# Patient Record
Sex: Male | Born: 1975 | Race: White | Hispanic: No | Marital: Married | Smoking: Former smoker
Health system: Southern US, Community
[De-identification: ages and names within clinical notes are randomized; demographics above are authoritative.]

## PROBLEM LIST (undated history)

## (undated) DIAGNOSIS — R7989 Other specified abnormal findings of blood chemistry: Secondary | ICD-10-CM

## (undated) HISTORY — PX: NASAL SEPTUM SURGERY: SHX37

## (undated) HISTORY — PX: NASAL SINUS SURGERY: SHX719

## (undated) HISTORY — PX: TURBINATE REDUCTION: SHX6157

## (undated) HISTORY — DX: Other specified abnormal findings of blood chemistry: R79.89

## (undated) HISTORY — PX: TONSILLECTOMY: SUR1361

---

## 2005-07-23 ENCOUNTER — Emergency Department: Payer: Self-pay | Admitting: Emergency Medicine

## 2006-06-05 ENCOUNTER — Ambulatory Visit: Payer: Self-pay | Admitting: Unknown Physician Specialty

## 2006-07-03 ENCOUNTER — Encounter: Payer: Self-pay | Admitting: Unknown Physician Specialty

## 2008-12-26 ENCOUNTER — Emergency Department (HOSPITAL_COMMUNITY): Admission: EM | Admit: 2008-12-26 | Discharge: 2008-12-27 | Payer: Self-pay | Admitting: Orthopaedic Surgery

## 2008-12-30 ENCOUNTER — Ambulatory Visit: Payer: Self-pay | Admitting: Family Medicine

## 2010-05-23 ENCOUNTER — Ambulatory Visit: Payer: Self-pay | Admitting: Unknown Physician Specialty

## 2010-05-24 LAB — PATHOLOGY REPORT

## 2010-05-31 LAB — GLUCOSE, CAPILLARY

## 2010-06-01 LAB — BASIC METABOLIC PANEL WITH GFR
BUN: 13 mg/dL (ref 6–23)
CO2: 25 meq/L (ref 19–32)
Calcium: 9.1 mg/dL (ref 8.4–10.5)
Chloride: 104 meq/L (ref 96–112)
Creatinine, Ser: 1.19 mg/dL (ref 0.4–1.5)
GFR calc non Af Amer: >60 mL/min
Glucose, Bld: 101 mg/dL — ABNORMAL HIGH (ref 70–99)
Potassium: 3.4 meq/L — ABNORMAL LOW (ref 3.5–5.1)
Sodium: 138 meq/L (ref 135–145)

## 2010-06-01 LAB — CBC
HCT: 43.6 % (ref 39.0–52.0)
Hemoglobin: 15.1 g/dL (ref 13.0–17.0)
MCHC: 34.6 g/dL (ref 30.0–36.0)
MCV: 88.5 fL (ref 78.0–100.0)
Platelets: 178 10*3/uL (ref 150–400)
RBC: 4.93 MIL/uL (ref 4.22–5.81)
RDW: 13.5 % (ref 11.5–15.5)
WBC: 10.3 10*3/uL (ref 4.0–10.5)

## 2010-06-01 LAB — PROTIME-INR
INR: 0.9 (ref 0.00–1.49)
Prothrombin Time: 12.1 s (ref 11.6–15.2)

## 2011-11-22 ENCOUNTER — Ambulatory Visit: Payer: Self-pay | Admitting: Family Medicine

## 2012-09-11 LAB — LIPID PANEL
CHOLESTEROL: 172 mg/dL (ref 0–200)
HDL: 43 mg/dL (ref 35–70)
LDL CALC: 112 mg/dL
LDL/HDL RATIO: 2.6
TRIGLYCERIDES: 83 mg/dL (ref 40–160)

## 2012-09-11 LAB — CBC AND DIFFERENTIAL
HCT: 47 % (ref 41–53)
Hemoglobin: 15.8 g/dL (ref 13.5–17.5)
NEUTROS ABS: 3 /uL
Platelets: 261 10*3/uL (ref 150–399)
WBC: 5.9 10*3/mL

## 2012-09-11 LAB — TSH: TSH: 1.07 u[IU]/mL (ref 0.41–5.90)

## 2012-09-11 LAB — HEPATIC FUNCTION PANEL
ALK PHOS: 43 U/L (ref 25–125)
ALT: 27 U/L (ref 10–40)
AST: 31 U/L (ref 14–40)
Bilirubin, Total: 1.2 mg/dL

## 2012-09-11 LAB — BASIC METABOLIC PANEL
BUN: 19 mg/dL (ref 4–21)
CREATININE: 1.4 mg/dL — AB (ref 0.6–1.3)
GLUCOSE: 82 mg/dL
Potassium: 4.2 mmol/L (ref 3.4–5.3)
Sodium: 140 mmol/L (ref 137–147)

## 2012-09-11 LAB — PSA: PSA: 1.6

## 2014-04-16 IMAGING — US ABDOMEN ULTRASOUND LIMITED
1 series · 13 of 25 positions shown · non-contrast
Comparison: none

REASON FOR EXAM: epigastric abd pain
COMMENTS:

[Series 1: abdomen ultrasound limited · 0.35mm/px · 13 of 86 slices shown]
[im 1/86]
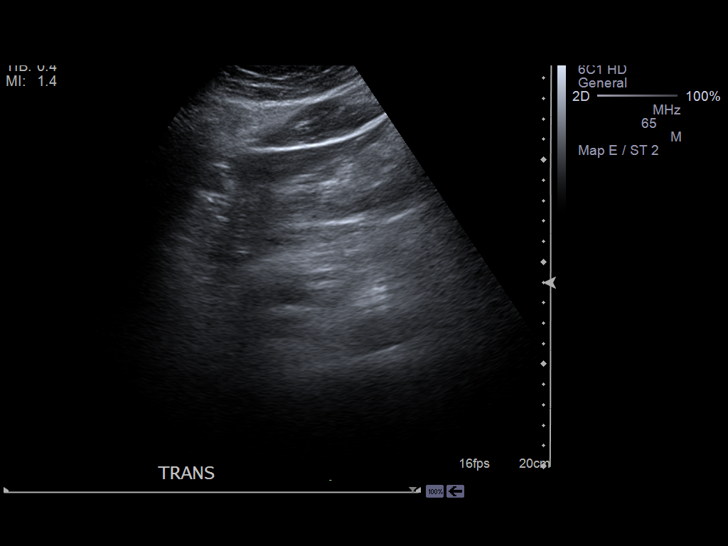
[im 8/86]
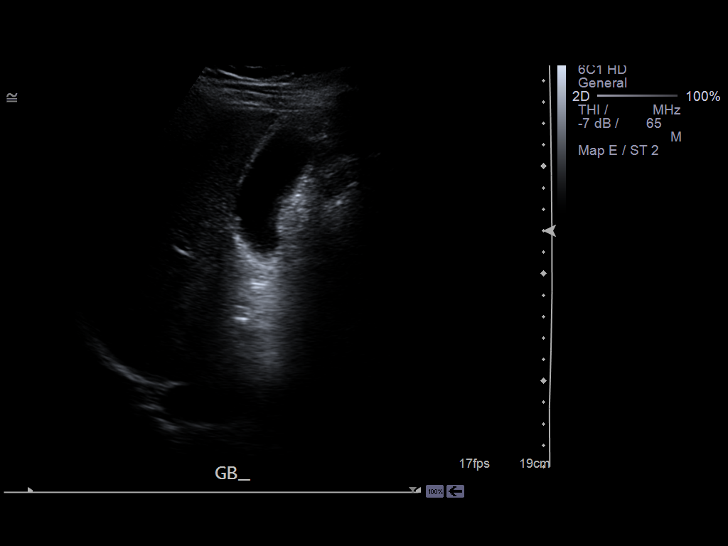
[im 15/86]
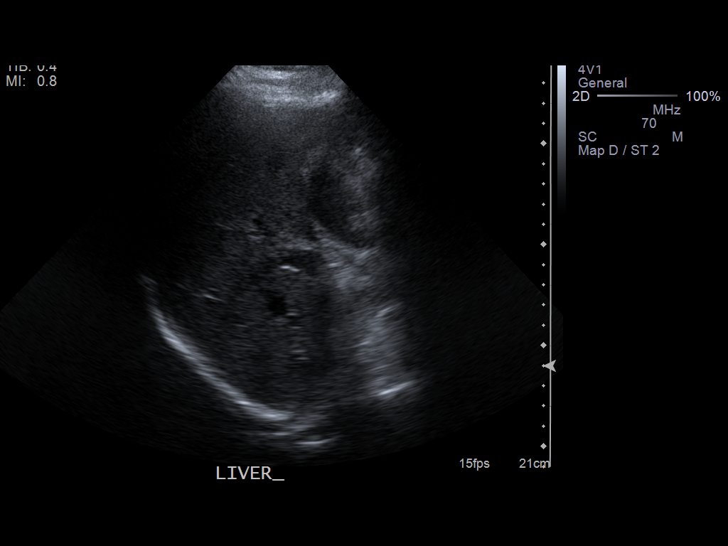
[im 22/86]
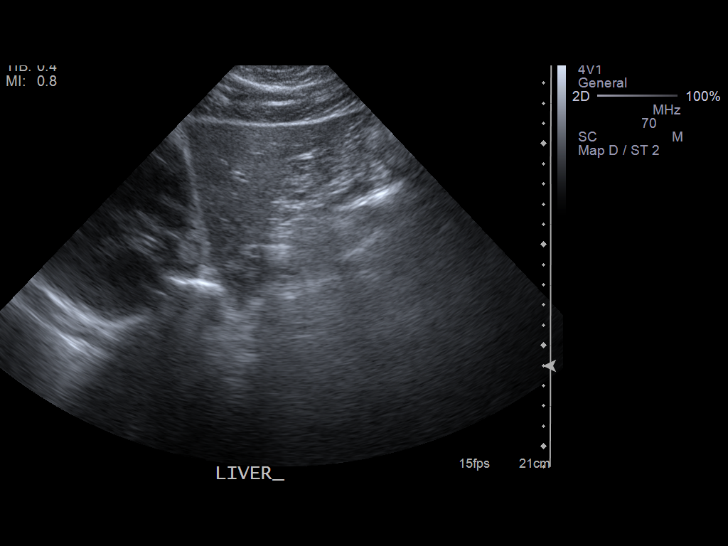
[im 29/86]
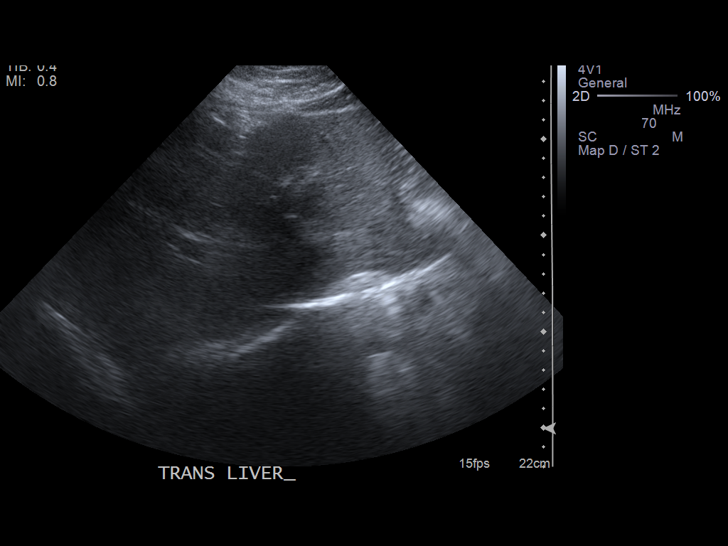
[im 36/86]
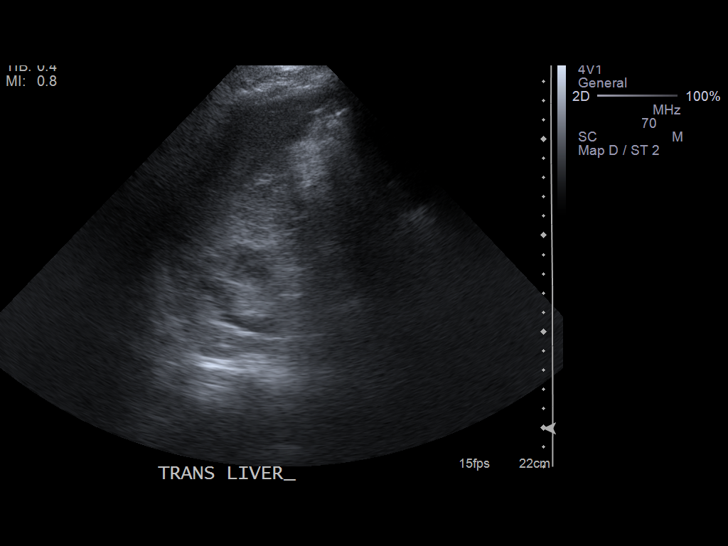
[im 43/86]
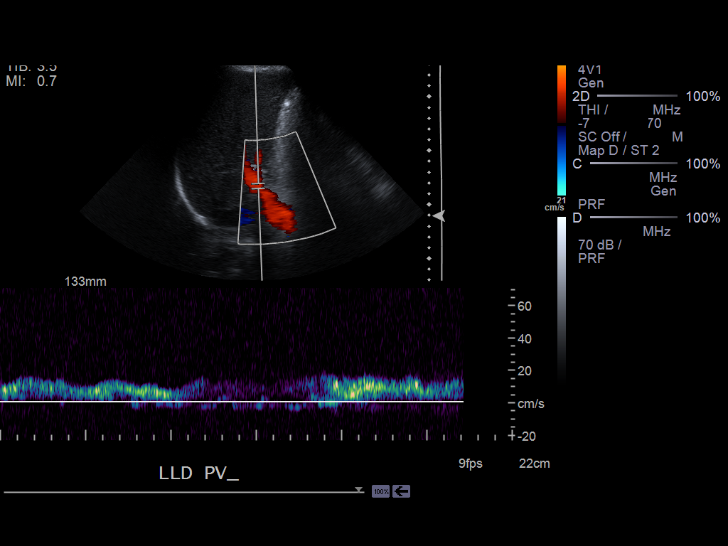
[im 50/86]
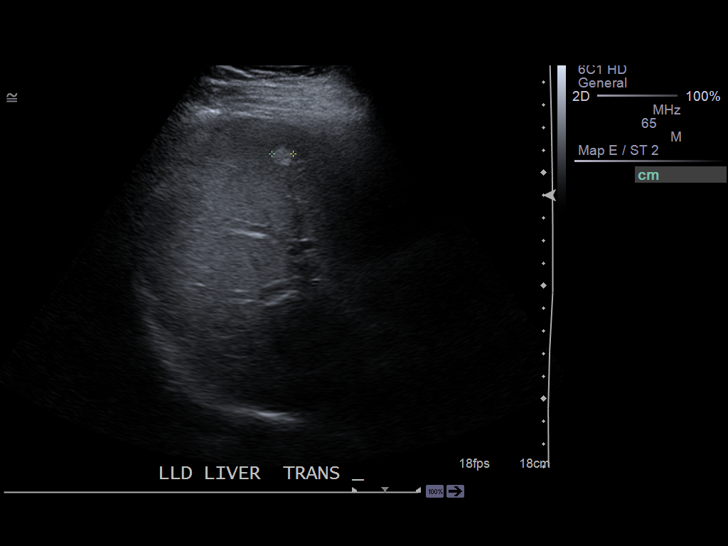
[im 57/86]
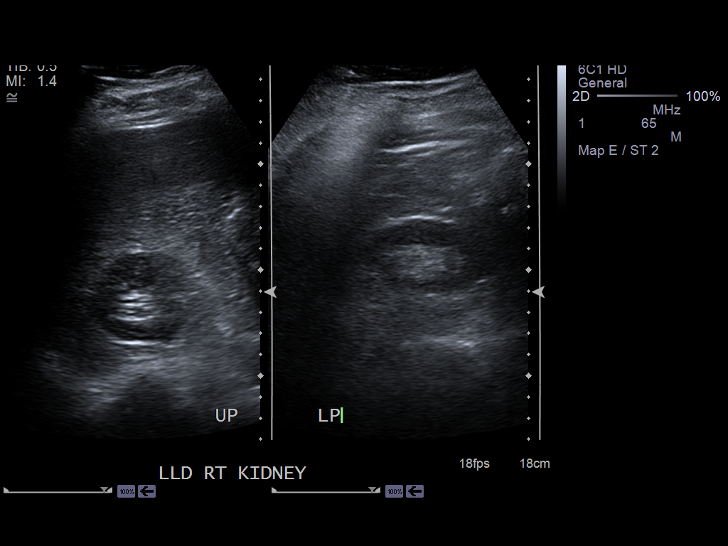
[im 64/86]
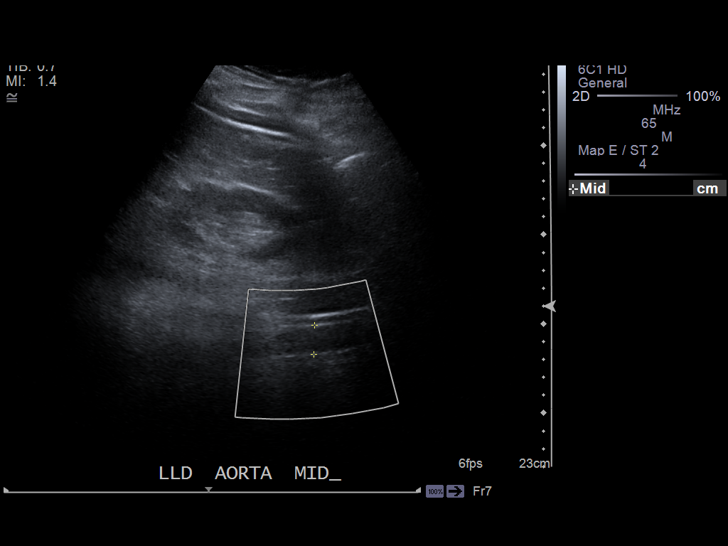
[im 71/86]
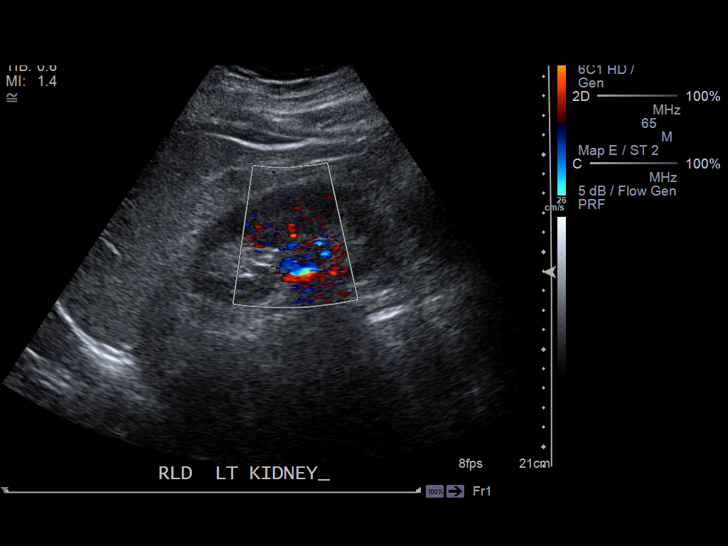
[im 78/86]
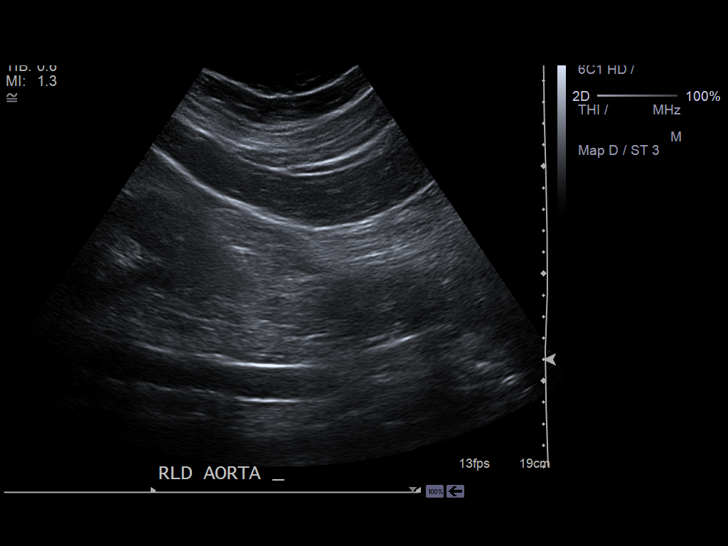
[im 86/86]
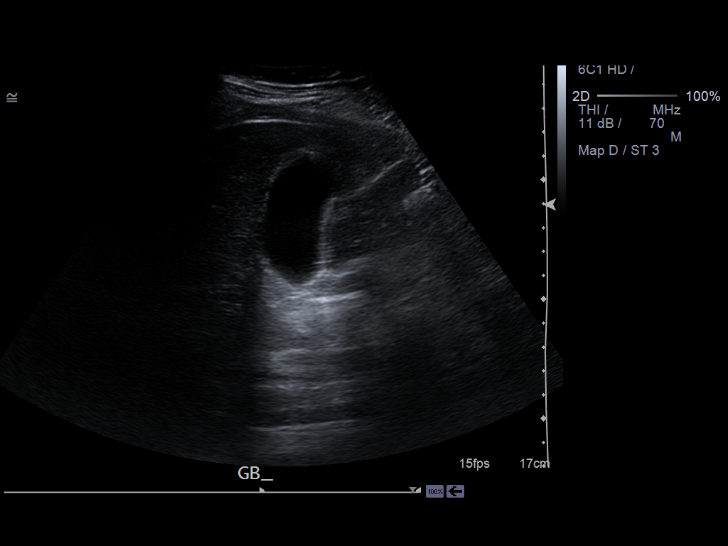

[13 of 25 positions shown; findings below may reference images not displayed]

PROCEDURE:     CALEB - CALEB ABDOMEN UPPER GENERAL  - November 22, 2011  [DATE]

RESULT:     The liver exhibits normal echotexture with the exception of a
hyperechoic focus in the right lobe measuring 9 mm in diameter. This is most
compatible with a hemangioma. There is no intrahepatic ductal dilation.
Portal venous flow is normal in direction toward the liver.

The gallbladder is adequately distended. There is a tiny echogenic focus
adherent to the gallbladder wall which may reflect a tiny polyp or
nonshadowing stone. It measures no more than 1 mm in diameter. There is no
positive sonographic Murphy's sign. The common bile duct measures 4.7 mm in
diameter.

The pancreas, abdominal aorta, and kidneys are normal in appearance. The
spleen is top normal in size at 12.6 cm. The inferior vena cava is
demonstrated and appears normal. The kidneys are normal in contour and
exhibit no evidence of obstruction or calcified stones. Both kidneys measure
10.3 cm in length.
IMPRESSION: 1. There is a 1 mm diameter hyperechoic focus adherent to the gallbladder
wall which may reflect a nonshadowing stone or polyp. There are no
sonographic findings to suggest acute cholecystitis.
2. There is a 9 mm diameter hyperechoic focus in the right hepatic lobe most
compatible with a hemangioma.
3. There is borderline splenomegaly.
4. The pancreas kidneys exhibit no acute abnormality.

[REDACTED]

## 2014-09-15 DIAGNOSIS — E669 Obesity, unspecified: Secondary | ICD-10-CM | POA: Insufficient documentation

## 2014-09-15 DIAGNOSIS — K219 Gastro-esophageal reflux disease without esophagitis: Secondary | ICD-10-CM | POA: Insufficient documentation

## 2014-09-15 DIAGNOSIS — Z87891 Personal history of nicotine dependence: Secondary | ICD-10-CM | POA: Insufficient documentation

## 2014-09-15 DIAGNOSIS — F32A Depression, unspecified: Secondary | ICD-10-CM | POA: Insufficient documentation

## 2014-09-15 DIAGNOSIS — E291 Testicular hypofunction: Secondary | ICD-10-CM | POA: Insufficient documentation

## 2014-09-15 DIAGNOSIS — G4720 Circadian rhythm sleep disorder, unspecified type: Secondary | ICD-10-CM | POA: Insufficient documentation

## 2014-09-15 DIAGNOSIS — F329 Major depressive disorder, single episode, unspecified: Secondary | ICD-10-CM | POA: Insufficient documentation

## 2014-09-16 ENCOUNTER — Encounter: Payer: Self-pay | Admitting: Family Medicine

## 2014-09-16 ENCOUNTER — Ambulatory Visit (INDEPENDENT_AMBULATORY_CARE_PROVIDER_SITE_OTHER): Payer: Managed Care, Other (non HMO) | Admitting: Family Medicine

## 2014-09-16 VITALS — BP 104/62 | HR 64 | Temp 97.8°F | Resp 16 | Ht 71.0 in | Wt 240.0 lb

## 2014-09-16 DIAGNOSIS — Z0000 Encounter for general adult medical examination without abnormal findings: Secondary | ICD-10-CM | POA: Diagnosis not present

## 2014-09-16 DIAGNOSIS — E291 Testicular hypofunction: Secondary | ICD-10-CM | POA: Diagnosis not present

## 2014-09-16 LAB — POCT URINALYSIS DIPSTICK
BILIRUBIN UA: NEGATIVE
GLUCOSE UA: NEGATIVE
Ketones, UA: NEGATIVE
LEUKOCYTES UA: NEGATIVE
Nitrite, UA: NEGATIVE
PROTEIN UA: NEGATIVE
RBC UA: NEGATIVE
SPEC GRAV UA: 1.02
UROBILINOGEN UA: 0.2
pH, UA: 6

## 2014-09-16 MED ORDER — TESTOSTERONE 20.25 MG/ACT (1.62%) TD GEL: 4.0000 [IU] | Freq: Every day | TRANSDERMAL | Status: DC

## 2014-09-16 NOTE — Progress Notes (Signed)
Patient ID: David Aguirre, male   DOB: [birth year], 39 y.o.   MRN: [id number]       [name], Male    DOB: 1975/03/09, 39 y.o.   MRN: [id number] Visit Date: 09/16/2014  Today's Provider: Wilhemena Durie, MD   Chief Complaint  Patient presents with  . Annual Exam   Subjective:    Annual physical exam David Aguirre is a 39 y.o. male who presents today for health maintenance and complete physical. He feels fairly well. He reports exercising once or twice a week. He reports he is sleeping fairly well.  ----------------------------------------------------------------- EKG- 11/20/11 Tdap-- pt is confident that he has had one in the past 5 years  Review of Systems  Constitutional: Positive for fatigue.       Patient definitely feels some fatigue when he is off of testosterone therapy. He has been off for 3 weeks and is having a little bit of fatigue now.  HENT: Negative.   Eyes: Negative.   Respiratory: Negative.   Cardiovascular: Negative.   Gastrointestinal: Negative.   Endocrine: Negative.   Genitourinary: Negative.        Recent single episode of ED.  Musculoskeletal: Negative.   Allergic/Immunologic: Negative.   Neurological: Negative.   Hematological: Negative.   Psychiatric/Behavioral: Negative.   All other systems reviewed and are negative.   Social History He  reports that he quit smoking about 16 years ago. He does not have any smokeless tobacco history on file. He reports that he drinks alcohol. He reports that he does not use illicit drugs.  Patient Active Problem List   Diagnosis Date Noted  . Clinical depression 09/15/2014  . Acid reflux 09/15/2014  . 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 History of tobacco use 09/15/2014  . 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 Tonsillectomy    . Nasal septum surgery    . Turbinate reduction Bilateral     Family  History His family history includes Diabetes in his mother; Hyperlipidemia in his father and mother; Prostate cancer in his paternal grandfather.    No Known Allergies  Previous Medications   MULTIPLE VITAMIN PO    Take by mouth.   PHENTERMINE (ADIPEX-P) 37.5 MG TABLET    Take by mouth.   TESTOSTERONE (ANDROGEL PUMP) 20.25 MG/ACT (1.62%) GEL    Place onto the skin.    Patient Care Team: Jerrol Banana., MD as PCP - General (Family Medicine)     Objective:   Vitals: BP 104/62 mmHg  Pulse 64  Temp(Src) 97.8 F (36.6 C) (Oral)  Resp 16  Ht 5\' 11"  (1.803 m)  Wt 240 lb (108.863 kg)  BMI 33.49 kg/m2   Physical Exam  Constitutional: He is oriented to person, place, and time. He appears well-developed and well-nourished.  HENT:  Head: Normocephalic and atraumatic.  Right Ear: External ear normal.  Left Ear: External ear normal.  Nose: Nose normal.  Mouth/Throat: Oropharynx is clear and moist.  Eyes: Conjunctivae are normal. Pupils are equal, round, and reactive to light.  Neck: Normal range of motion. Neck supple.  Cardiovascular: Normal rate, regular rhythm, normal heart sounds and intact distal pulses.   Pulmonary/Chest: Effort normal and breath sounds normal.  Abdominal: Soft. Bowel sounds are normal.  Genitourinary: Penis normal.  Musculoskeletal: Normal range of motion.  Neurological: He is alert and oriented to person, place, and time. No cranial nerve deficit. He  exhibits normal muscle tone. Coordination normal.  Skin: Skin is warm and dry.  Several nevi on back which are benign.  Psychiatric: He has a normal mood and affect. His behavior is normal. Judgment and thought content normal.     Depression Screen No flowsheet data found.    Assessment & Plan:     Routine Health Maintenance and Physical Exam  Exercise Activities and Dietary recommendations Goals    None       There is no immunization history on file for this patient.  Health  Maintenance  Topic Date Due  . HIV Screening  08/11/1991  . TETANUS/TDAP  08/11/1995  . INFLUENZA VACCINE  09/27/2014  D    Discussed health benefits of physical activity, and encouraged him to engage in regular exercise appropriate for his age and condition.     Hypogonadism --Discussed at length with pt. Restart Adndrogel at 4 pumps daily.RTC 4 months to reassess.Obtain Prolactin today. PSA yearly while on therapy. I have done the exam and reviewed the above chart and it is accurate to the best of my knowledge.  --------------------------------------------------------------------

## 2014-09-17 LAB — CBC WITH DIFFERENTIAL/PLATELET
BASOS: 1 %
Basophils Absolute: 0 10*3/uL (ref 0.0–0.2)
EOS (ABSOLUTE): 0.1 10*3/uL (ref 0.0–0.4)
Eos: 2 %
HEMATOCRIT: 46.5 % (ref 37.5–51.0)
Hemoglobin: 16 g/dL (ref 12.6–17.7)
IMMATURE GRANULOCYTES: 0 %
Immature Grans (Abs): 0 10*3/uL (ref 0.0–0.1)
Lymphocytes Absolute: 2.3 10*3/uL (ref 0.7–3.1)
Lymphs: 35 %
MCH: 29.2 pg (ref 26.6–33.0)
MCHC: 34.4 g/dL (ref 31.5–35.7)
MCV: 85 fL (ref 79–97)
MONOCYTES: 9 %
Monocytes Absolute: 0.6 10*3/uL (ref 0.1–0.9)
NEUTROS ABS: 3.6 10*3/uL (ref 1.4–7.0)
Neutrophils: 53 %
Platelets: 268 10*3/uL (ref 150–379)
RBC: 5.48 x10E6/uL (ref 4.14–5.80)
RDW: 13.6 % (ref 12.3–15.4)
WBC: 6.6 10*3/uL (ref 3.4–10.8)

## 2014-09-17 LAB — LIPID PANEL WITH LDL/HDL RATIO
Cholesterol, Total: 164 mg/dL (ref 100–199)
HDL: 44 mg/dL (ref >39)
LDL CALC: 105 mg/dL — AB (ref 0–99)
LDL/HDL RATIO: 2.4 ratio (ref 0.0–3.6)
TRIGLYCERIDES: 74 mg/dL (ref 0–149)
VLDL Cholesterol Cal: 15 mg/dL (ref 5–40)

## 2014-09-17 LAB — COMPREHENSIVE METABOLIC PANEL
ALT: 26 IU/L (ref 0–44)
AST: 23 IU/L (ref 0–40)
Albumin/Globulin Ratio: 2.4 (ref 1.1–2.5)
Albumin: 4.7 g/dL (ref 3.5–5.5)
Alkaline Phosphatase: 40 IU/L (ref 39–117)
BILIRUBIN TOTAL: 0.5 mg/dL (ref 0.0–1.2)
BUN / CREAT RATIO: 19 (ref 8–19)
BUN: 25 mg/dL — AB (ref 6–20)
CALCIUM: 9.3 mg/dL (ref 8.7–10.2)
CO2: 25 mmol/L (ref 18–29)
Chloride: 100 mmol/L (ref 97–108)
Creatinine, Ser: 1.3 mg/dL — ABNORMAL HIGH (ref 0.76–1.27)
GFR calc Af Amer: 80 mL/min/{1.73_m2} (ref >59)
GFR calc non Af Amer: 69 mL/min/{1.73_m2} (ref >59)
Globulin, Total: 2 g/dL (ref 1.5–4.5)
Glucose: 92 mg/dL (ref 65–99)
Potassium: 4.8 mmol/L (ref 3.5–5.2)
Sodium: 141 mmol/L (ref 134–144)
TOTAL PROTEIN: 6.7 g/dL (ref 6.0–8.5)

## 2014-09-17 LAB — TESTOSTERONE, FREE, TOTAL, SHBG: Testosterone, Free: 8.6 pg/mL — ABNORMAL LOW (ref 8.7–25.1)

## 2014-09-17 LAB — PROLACTIN: PROLACTIN: 7 ng/mL (ref 4.0–15.2)

## 2014-09-17 LAB — TSH: TSH: 1.21 u[IU]/mL (ref 0.450–4.500)

## 2014-11-04 ENCOUNTER — Other Ambulatory Visit: Payer: Self-pay

## 2014-11-04 NOTE — Telephone Encounter (Signed)
Fax from pharmacy for refill-aa

## 2014-11-05 MED ORDER — PHENTERMINE HCL 37.5 MG PO TABS: 37.5000 mg | ORAL_TABLET | Freq: Every day | ORAL | Status: DC

## 2014-11-27 ENCOUNTER — Encounter: Payer: Self-pay | Admitting: Emergency Medicine

## 2014-11-27 ENCOUNTER — Emergency Department: Payer: Managed Care, Other (non HMO)

## 2014-11-27 ENCOUNTER — Observation Stay
Admission: EM | Admit: 2014-11-27 | Discharge: 2014-11-29 | Disposition: A | Discharge status: Home / Self Care | Payer: Managed Care, Other (non HMO) | Attending: Surgery | Admitting: Surgery

## 2014-11-27 DIAGNOSIS — Z833 Family history of diabetes mellitus: Secondary | ICD-10-CM | POA: Insufficient documentation

## 2014-11-27 DIAGNOSIS — E291 Testicular hypofunction: Secondary | ICD-10-CM | POA: Insufficient documentation

## 2014-11-27 DIAGNOSIS — Z9889 Other specified postprocedural states: Secondary | ICD-10-CM | POA: Insufficient documentation

## 2014-11-27 DIAGNOSIS — Z8489 Family history of other specified conditions: Secondary | ICD-10-CM | POA: Insufficient documentation

## 2014-11-27 DIAGNOSIS — D1803 Hemangioma of intra-abdominal structures: Secondary | ICD-10-CM | POA: Insufficient documentation

## 2014-11-27 DIAGNOSIS — Z8042 Family history of malignant neoplasm of prostate: Secondary | ICD-10-CM | POA: Diagnosis not present

## 2014-11-27 DIAGNOSIS — K8012 Calculus of gallbladder with acute and chronic cholecystitis without obstruction: Principal | ICD-10-CM | POA: Insufficient documentation

## 2014-11-27 DIAGNOSIS — K8050 Calculus of bile duct without cholangitis or cholecystitis without obstruction: Secondary | ICD-10-CM | POA: Diagnosis present

## 2014-11-27 DIAGNOSIS — Z87891 Personal history of nicotine dependence: Secondary | ICD-10-CM | POA: Diagnosis not present

## 2014-11-27 LAB — TROPONIN I: Troponin I: <0.03 ng/mL (ref <0.031)

## 2014-11-27 LAB — URINALYSIS COMPLETE WITH MICROSCOPIC (ARMC ONLY)
BACTERIA UA: NONE SEEN
BILIRUBIN URINE: NEGATIVE
Glucose, UA: NEGATIVE mg/dL
HGB URINE DIPSTICK: NEGATIVE
Ketones, ur: NEGATIVE mg/dL
LEUKOCYTES UA: NEGATIVE
Nitrite: NEGATIVE
PH: 5 (ref 5.0–8.0)
PROTEIN: NEGATIVE mg/dL
SQUAMOUS EPITHELIAL / LPF: NONE SEEN
Specific Gravity, Urine: 1.013 (ref 1.005–1.030)

## 2014-11-27 LAB — COMPREHENSIVE METABOLIC PANEL
ALBUMIN: 4.2 g/dL (ref 3.5–5.0)
ALT: 23 U/L (ref 17–63)
AST: 25 U/L (ref 15–41)
Alkaline Phosphatase: 36 U/L — ABNORMAL LOW (ref 38–126)
Anion gap: 8 (ref 5–15)
BILIRUBIN TOTAL: 0.9 mg/dL (ref 0.3–1.2)
BUN: 18 mg/dL (ref 6–20)
CO2: 27 mmol/L (ref 22–32)
Calcium: 9.2 mg/dL (ref 8.9–10.3)
Chloride: 100 mmol/L — ABNORMAL LOW (ref 101–111)
Creatinine, Ser: 1.2 mg/dL (ref 0.61–1.24)
GFR calc Af Amer: >60 mL/min (ref >60)
GFR calc non Af Amer: >60 mL/min (ref >60)
GLUCOSE: 116 mg/dL — AB (ref 65–99)
POTASSIUM: 4 mmol/L (ref 3.5–5.1)
SODIUM: 135 mmol/L (ref 135–145)
TOTAL PROTEIN: 6.7 g/dL (ref 6.5–8.1)

## 2014-11-27 LAB — CBC WITH DIFFERENTIAL/PLATELET
BASOS ABS: 0.1 10*3/uL (ref 0–0.1)
BASOS PCT: 1 %
Eosinophils Absolute: 0.1 10*3/uL (ref 0–0.7)
Eosinophils Relative: 1 %
HEMATOCRIT: 48.6 % (ref 40.0–52.0)
HEMOGLOBIN: 16.1 g/dL (ref 13.0–18.0)
Lymphocytes Relative: 11 %
Lymphs Abs: 1.4 10*3/uL (ref 1.0–3.6)
MCH: 28.9 pg (ref 26.0–34.0)
MCHC: 33.1 g/dL (ref 32.0–36.0)
MCV: 87.3 fL (ref 80.0–100.0)
MONO ABS: 0.7 10*3/uL (ref 0.2–1.0)
Monocytes Relative: 6 %
NEUTROS ABS: 10.3 10*3/uL — AB (ref 1.4–6.5)
NEUTROS PCT: 83 %
Platelets: 229 10*3/uL (ref 150–440)
RBC: 5.57 MIL/uL (ref 4.40–5.90)
RDW: 13.9 % (ref 11.5–14.5)
WBC: 12.5 10*3/uL — ABNORMAL HIGH (ref 3.8–10.6)

## 2014-11-27 LAB — LIPASE, BLOOD: Lipase: 36 U/L (ref 22–51)

## 2014-11-27 MED ORDER — MORPHINE SULFATE (PF) 4 MG/ML IV SOLN
4.0000 mg | Freq: Once | INTRAVENOUS | Status: AC
  Administered 2014-11-27: 4 mg via INTRAVENOUS
  Filled 2014-11-27: qty 1

## 2014-11-27 MED ORDER — SODIUM CHLORIDE 0.9 % IV BOLUS (SEPSIS)
1000.0000 mL | Freq: Once | INTRAVENOUS | Status: AC
  Administered 2014-11-27: 1000 mL via INTRAVENOUS

## 2014-11-27 MED ORDER — ONDANSETRON HCL 4 MG/2ML IJ SOLN
4.0000 mg | Freq: Once | INTRAMUSCULAR | Status: AC
Start: 2014-11-27 — End: 2014-11-27
  Administered 2014-11-27: 4 mg via INTRAVENOUS
  Filled 2014-11-27: qty 2

## 2014-11-27 MED ORDER — MORPHINE SULFATE (PF) 4 MG/ML IV SOLN
4.0000 mg | Freq: Once | INTRAVENOUS | Status: AC
  Administered 2014-11-27: 4 mg via INTRAVENOUS

## 2014-11-27 MED ORDER — MORPHINE SULFATE (PF) 4 MG/ML IV SOLN
INTRAVENOUS | Status: AC
  Administered 2014-11-27: 4 mg via INTRAVENOUS
  Filled 2014-11-27: qty 1

## 2014-11-27 NOTE — ED Notes (Signed)
Denies nausea, no vomiting, no diarrhea

## 2014-11-27 NOTE — ED Provider Notes (Signed)
[facility] Emergency Department Provider Note  Time seen: 9:48 PM  I have reviewed the triage vital signs and the nursing notes.   HISTORY  Chief Complaint Abdominal Pain    HPI David Aguirre is a 39 y.o. male with no past medical history who presents the emergency department with right upper quadrant pain. According to the patient around 11 AM this morning he developed right upper quadrant pain which has been progressive and unrelenting since. Denies any pain similar in the past. Denies nausea, vomiting, diarrhea. Denies fever. Denies any known modifying factors, of note the patient has not attempted to eat anything.Describes the pain as moderate to severe currently.    History reviewed. No pertinent past medical history.  Patient Active Problem List   Diagnosis Date Noted  . 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 Clinical depression 09/15/2014  . Acid reflux 09/15/2014  . 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 History of tobacco use 09/15/2014  . 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 Tonsillectomy    . Nasal septum surgery    . Turbinate reduction Bilateral   . Nasal sinus surgery      Current Outpatient Rx  Name  Route  Sig  Dispense  Refill  . MULTIPLE VITAMIN PO   Oral   Take by mouth.         . phentermine (ADIPEX-P) 37.5 MG tablet   Oral   Take 1 tablet (37.5 mg total) by mouth daily before breakfast.   30 tablet   0   . Testosterone (ANDROGEL PUMP) 20.25 MG/ACT (1.62%) GEL   Transdermal   Place 4 Units onto the skin daily. 4 pumps daily QS   150 g   5     Allergies Review of patient's allergies indicates no known allergies.  Family History  Problem Relation Age of Onset  . Diabetes Mother   . Hyperlipidemia Mother   . Hyperlipidemia Father   . Prostate cancer Paternal Grandfather     Social History Social History  Substance Use Topics  . Smoking  status: Former Smoker    Quit date: 02/26/1998  . Smokeless tobacco: None  . Alcohol Use: Yes     Comment: 2-3 times a week    Review of Systems Constitutional: Negative for fever. Cardiovascular: Negative for chest pain. Respiratory: Negative for shortness of breath. Gastrointestinal: Right upper quadrant abdominal pain. Genitourinary: Negative for dysuria. Musculoskeletal: Negative for back pain. Neurological: Negative for headaches, focal weakness or numbness. 10-point ROS otherwise negative.  ____________________________________________   PHYSICAL EXAM:  VITAL SIGNS: ED Triage Vitals  Enc Vitals Group     BP 11/27/14 1852 146/97 mmHg     Pulse Rate 11/27/14 1852 0     Resp 11/27/14 1852 20     Temp 11/27/14 1852 97.8 F (36.6 C)     Temp Source 11/27/14 1852 Oral     SpO2 11/27/14 1852 100 %     Weight 11/27/14 1852 248 lb (112.492 kg)     Height 11/27/14 1852 6' (1.829 m)     Head Cir --      Peak Flow --      Pain Score 11/27/14 1853 2     Pain Loc --      Pain Edu? --      Excl. in [place]? --    Constitutional: Alert and oriented. Mild distress due to abdominal pain. Eyes: Normal exam ENT  Head: Normocephalic and atraumatic   Mouth/Throat: Mucous membranes are moist. Cardiovascular: Normal rate, regular rhythm. No murmur Respiratory: Normal respiratory effort without tachypnea nor retractions. Breath sounds are clear  Gastrointestinal: Soft, mild epigastric tenderness palpation. No rebound or guarding. No distention. Musculoskeletal: Nontender with normal range of motion in all extremities Neurologic:  Normal speech and language. No gross focal neurologic deficits  Psychiatric: Mood and affect are normal. Speech and behavior are normal.  ____________________________________________    EKG  EKG reviewed and interpreted by myself shows sinus rhythm at 70 bpm, narrow QRS, normal axis, normal intervals, no ST changes  noted.  ____________________________________________    RADIOLOGY  Gallstone in the neck of the gallbladder.  ____________________________________________    INITIAL IMPRESSION / ASSESSMENT AND PLAN / ED COURSE  Pertinent labs & imaging results that were available during my care of the patient were reviewed by me and considered in my medical decision making (see chart for details).  Patient with approximately 11 hours of right upper quadrant abdominal pain which began at 11 AM. We will dose pain medication, and obtain an ultrasound to help further evaluate. Currently labs are largely within normal limits besides a mild leukocytosis. Minimal tenderness on exam. I have also added on a troponin although the patient does have a normal EKG.  Labs are largely within normal limits besides leukocytosis of 12.5. Patient continues to be uncomfortable status post 2 rounds of IV morphine. He states the pain is much improved but continues to have pain. His ultrasound shows a gallstone in the neck of the gallbladder. I discussed the patient with Dr. Marina Gravel will be down to see the patient to discuss further management.  ____________________________________________   FINAL CLINICAL IMPRESSION(S) / ED DIAGNOSES  Epigastric abdominal pain and right upper quadrant abdominal pain. Biliary colic  Harvest Dark, MD 11/27/14 [phone]

## 2014-11-28 ENCOUNTER — Encounter
Admission: EM | Disposition: A | Discharge status: Home / Self Care | Payer: Self-pay | Source: Home / Self Care | Attending: Emergency Medicine

## 2014-11-28 ENCOUNTER — Observation Stay: Payer: Managed Care, Other (non HMO) | Admitting: Anesthesiology

## 2014-11-28 ENCOUNTER — Encounter: Payer: Self-pay | Admitting: Anesthesiology

## 2014-11-28 DIAGNOSIS — K8050 Calculus of bile duct without cholangitis or cholecystitis without obstruction: Secondary | ICD-10-CM

## 2014-11-28 DIAGNOSIS — K8020 Calculus of gallbladder without cholecystitis without obstruction: Secondary | ICD-10-CM | POA: Diagnosis not present

## 2014-11-28 HISTORY — PX: CHOLECYSTECTOMY: SHX55

## 2014-11-28 LAB — MRSA PCR SCREENING: MRSA by PCR: NEGATIVE

## 2014-11-28 SURGERY — LAPAROSCOPIC CHOLECYSTECTOMY WITH INTRAOPERATIVE CHOLANGIOGRAM
Anesthesia: General

## 2014-11-28 MED ORDER — OXYCODONE-ACETAMINOPHEN 5-325 MG PO TABS: 1.0000 | ORAL_TABLET | ORAL | Status: DC | PRN

## 2014-11-28 MED ORDER — LACTATED RINGERS IV SOLN
INTRAVENOUS | Status: DC
  Administered 2014-11-28: 15:00:00 via INTRAVENOUS

## 2014-11-28 MED ORDER — BUPIVACAINE-EPINEPHRINE (PF) 0.25% -1:200000 IJ SOLN
INTRAMUSCULAR | Status: DC | PRN
  Administered 2014-11-28: 30 mL via PERINEURAL

## 2014-11-28 MED ORDER — FENTANYL CITRATE (PF) 100 MCG/2ML IJ SOLN
INTRAMUSCULAR | Status: DC | PRN
  Administered 2014-11-28 (×2): 100 ug via INTRAVENOUS
  Administered 2014-11-28: 150 ug via INTRAVENOUS

## 2014-11-28 MED ORDER — LACTATED RINGERS IV SOLN
INTRAVENOUS | Status: DC | PRN
  Administered 2014-11-28: 13:00:00 via INTRAVENOUS

## 2014-11-28 MED ORDER — FENTANYL CITRATE (PF) 100 MCG/2ML IJ SOLN
INTRAMUSCULAR | Status: AC
  Filled 2014-11-28: qty 2

## 2014-11-28 MED ORDER — BUPIVACAINE HCL (PF) 0.25 % IJ SOLN
INTRAMUSCULAR | Status: AC
  Filled 2014-11-28: qty 30

## 2014-11-28 MED ORDER — PROPOFOL 10 MG/ML IV BOLUS
INTRAVENOUS | Status: DC | PRN
  Administered 2014-11-28: 200 mg via INTRAVENOUS

## 2014-11-28 MED ORDER — GLYCOPYRROLATE 0.2 MG/ML IJ SOLN
INTRAMUSCULAR | Status: DC | PRN
  Administered 2014-11-28: .8 mg via INTRAVENOUS

## 2014-11-28 MED ORDER — OXYCODONE HCL 5 MG/5ML PO SOLN: 5.0000 mg | Freq: Once | ORAL | Status: DC | PRN

## 2014-11-28 MED ORDER — ROCURONIUM BROMIDE 100 MG/10ML IV SOLN
INTRAVENOUS | Status: DC | PRN
  Administered 2014-11-28: 50 mg via INTRAVENOUS

## 2014-11-28 MED ORDER — ONDANSETRON HCL 4 MG PO TABS
4.0000 mg | ORAL_TABLET | Freq: Four times a day (QID) | ORAL | Status: DC | PRN
Start: 2014-11-28 — End: 2014-11-29

## 2014-11-28 MED ORDER — FENTANYL CITRATE (PF) 100 MCG/2ML IJ SOLN
25.0000 ug | INTRAMUSCULAR | Status: DC | PRN
  Administered 2014-11-28 (×2): 25 ug via INTRAVENOUS

## 2014-11-28 MED ORDER — ONDANSETRON HCL 4 MG/2ML IJ SOLN: 4.0000 mg | Freq: Four times a day (QID) | INTRAMUSCULAR | Status: DC | PRN

## 2014-11-28 MED ORDER — ACETAMINOPHEN 650 MG RE SUPP: 650.0000 mg | Freq: Four times a day (QID) | RECTAL | Status: DC | PRN

## 2014-11-28 MED ORDER — ENOXAPARIN SODIUM 40 MG/0.4ML ~~LOC~~ SOLN
40.0000 mg | Freq: Every day | SUBCUTANEOUS | Status: DC
  Administered 2014-11-28 – 2014-11-29 (×2): 40 mg via SUBCUTANEOUS
  Filled 2014-11-28: qty 0.4

## 2014-11-28 MED ORDER — OXYCODONE-ACETAMINOPHEN 5-325 MG PO TABS
1.0000 | ORAL_TABLET | ORAL | Status: DC | PRN
  Administered 2014-11-29: 1 via ORAL
  Filled 2014-11-28: qty 1

## 2014-11-28 MED ORDER — MIDAZOLAM HCL 2 MG/2ML IJ SOLN
INTRAMUSCULAR | Status: DC | PRN
  Administered 2014-11-28: 2 mg via INTRAVENOUS

## 2014-11-28 MED ORDER — SUCCINYLCHOLINE CHLORIDE 20 MG/ML IJ SOLN
INTRAMUSCULAR | Status: DC | PRN
  Administered 2014-11-28: 100 mg via INTRAVENOUS

## 2014-11-28 MED ORDER — OXYCODONE HCL 5 MG PO TABS: 5.0000 mg | ORAL_TABLET | Freq: Once | ORAL | Status: DC | PRN

## 2014-11-28 MED ORDER — BUPIVACAINE-EPINEPHRINE (PF) 0.25% -1:200000 IJ SOLN
INTRAMUSCULAR | Status: AC
  Filled 2014-11-28: qty 30

## 2014-11-28 MED ORDER — KCL IN DEXTROSE-NACL 20-5-0.45 MEQ/L-%-% IV SOLN
INTRAVENOUS | Status: DC
  Administered 2014-11-28 (×2): via INTRAVENOUS
  Filled 2014-11-28 (×5): qty 1000

## 2014-11-28 MED ORDER — NEOSTIGMINE METHYLSULFATE 10 MG/10ML IV SOLN
INTRAVENOUS | Status: DC | PRN
  Administered 2014-11-28: 4 mg via INTRAVENOUS

## 2014-11-28 MED ORDER — SODIUM CHLORIDE 0.9 % IV SOLN
3.0000 g | Freq: Four times a day (QID) | INTRAVENOUS | Status: DC
  Administered 2014-11-28 – 2014-11-29 (×6): 3 g via INTRAVENOUS
  Filled 2014-11-28 (×11): qty 3

## 2014-11-28 MED ORDER — ACETAMINOPHEN 325 MG PO TABS: 650.0000 mg | ORAL_TABLET | Freq: Four times a day (QID) | ORAL | Status: DC | PRN

## 2014-11-28 MED ORDER — HYDROMORPHONE HCL 1 MG/ML IJ SOLN
1.0000 mg | INTRAMUSCULAR | Status: DC | PRN
  Administered 2014-11-28 – 2014-11-29 (×7): 1 mg via INTRAVENOUS
  Filled 2014-11-28 (×8): qty 1

## 2014-11-28 MED ORDER — LIDOCAINE HCL (CARDIAC) 20 MG/ML IV SOLN
INTRAVENOUS | Status: DC | PRN
  Administered 2014-11-28: 50 mg via INTRAVENOUS

## 2014-11-28 SURGICAL SUPPLY — 44 items
ADH LQ OCL WTPRF AMP STRL LF (MISCELLANEOUS) ×1
ADHESIVE MASTISOL STRL (MISCELLANEOUS) ×3 IMPLANT
APPLIER CLIP ROT 10 11.4 M/L (STAPLE) ×3
APR CLP MED LRG 11.4X10 (STAPLE) ×1
BLADE SURG SZ11 CARB STEEL (BLADE) ×3 IMPLANT
CANISTER SUCT 1200ML W/VALVE (MISCELLANEOUS) ×3 IMPLANT
CATH CHOLANGI 4FR 420404F (CATHETERS) ×3 IMPLANT
CHLORAPREP W/TINT 26ML (MISCELLANEOUS) ×3 IMPLANT
CLIP APPLIE ROT 10 11.4 M/L (STAPLE) ×1 IMPLANT
CLOSURE WOUND 1/2 X4 (GAUZE/BANDAGES/DRESSINGS) ×1
CONRAY 60ML FOR OR (MISCELLANEOUS) ×3 IMPLANT
DRAPE C-ARM XRAY 36X54 (DRAPES) ×3 IMPLANT
ENDOPOUCH RETRIEVER 10 (MISCELLANEOUS) ×3 IMPLANT
GAUZE SPONGE NON-WVN 2X2 STRL (MISCELLANEOUS) ×4 IMPLANT
GLOVE BIO SURGEON STRL SZ8 (GLOVE) ×3 IMPLANT
GOWN STRL REUS W/ TWL LRG LVL3 (GOWN DISPOSABLE) ×4 IMPLANT
GOWN STRL REUS W/TWL LRG LVL3 (GOWN DISPOSABLE) ×12
IRRIGATION STRYKERFLOW (MISCELLANEOUS) ×1 IMPLANT
IRRIGATOR STRYKERFLOW (MISCELLANEOUS) ×3
IV CATH ANGIO 12GX3 LT BLUE (NEEDLE) ×3 IMPLANT
IV NS 1000ML (IV SOLUTION) ×3
IV NS 1000ML BAXH (IV SOLUTION) ×1 IMPLANT
JACKSON PRATT 10 (INSTRUMENTS) ×3 IMPLANT
KIT RM TURNOVER STRD PROC AR (KITS) ×3 IMPLANT
LABEL OR SOLS (LABEL) ×3 IMPLANT
NDL SAFETY 22GX1.5 (NEEDLE) ×3 IMPLANT
NEEDLE VERESS 14GA 120MM (NEEDLE) ×3 IMPLANT
NS IRRIG 500ML POUR BTL (IV SOLUTION) ×3 IMPLANT
PACK LAP CHOLECYSTECTOMY (MISCELLANEOUS) ×3 IMPLANT
PAD GROUND ADULT SPLIT (MISCELLANEOUS) ×3 IMPLANT
SCISSORS METZENBAUM CVD 33 (INSTRUMENTS) ×3 IMPLANT
SLEEVE ENDOPATH XCEL 5M (ENDOMECHANICALS) ×3 IMPLANT
SPONGE EXCIL AMD DRAIN 4X4 6P (MISCELLANEOUS) ×3 IMPLANT
SPONGE LAP 18X18 5 PK (GAUZE/BANDAGES/DRESSINGS) ×3 IMPLANT
SPONGE VERSALON 2X2 STRL (MISCELLANEOUS) ×12
STRIP CLOSURE SKIN 1/2X4 (GAUZE/BANDAGES/DRESSINGS) ×2 IMPLANT
SUT MNCRL 4-0 (SUTURE) ×3
SUT MNCRL 4-0 27XMFL (SUTURE) ×1
SUT VICRYL 0 AB UR-6 (SUTURE) ×3 IMPLANT
SUTURE MNCRL 4-0 27XMF (SUTURE) ×1 IMPLANT
SYR 20CC LL (SYRINGE) ×3 IMPLANT
TROCAR XCEL NON-BLD 11X100MML (ENDOMECHANICALS) ×3 IMPLANT
TROCAR XCEL NON-BLD 5MMX100MML (ENDOMECHANICALS) ×6 IMPLANT
TUBING INSUFFLATOR HI FLOW (MISCELLANEOUS) ×3 IMPLANT

## 2014-11-28 NOTE — ED Notes (Signed)
Notified lab again that date and time are wrong for troponin. Lab states they will try to fix it.

## 2014-11-28 NOTE — Anesthesia Preprocedure Evaluation (Addendum)
Anesthesia Evaluation  Patient identified by MRN, date of birth, ID band Patient awake    Reviewed: Allergy & Precautions, H&P , NPO status , Patient's Chart, lab work & pertinent test results  History of Anesthesia Complications Negative for: history of anesthetic complications  Airway Mallampati: II  TM Distance: >3 FB Neck ROM: full    Dental no notable dental hx. (+) Teeth Intact   Pulmonary neg shortness of breath, former smoker,    Pulmonary exam normal breath sounds clear to auscultation       Cardiovascular Exercise Tolerance: Good (-) Past MI negative cardio ROS Normal cardiovascular exam Rhythm:regular Rate:Normal     Neuro/Psych negative neurological ROS  negative psych ROS   GI/Hepatic negative GI ROS, Neg liver ROS, GERD  Controlled,  Endo/Other  negative endocrine ROS  Renal/GU negative Renal ROS  negative genitourinary   Musculoskeletal   Abdominal   Peds  Hematology negative hematology ROS (+)   Anesthesia Other Findings Patient Active Problem List  Diagnosis Date Noted . 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 Clinical depression 09/15/2014 . Acid reflux 09/15/2014 . 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 History of tobacco use 09/15/2014 . Adiposity 09/15/2014 . 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   Dental Advisory Given  Plan Discussed with: Anesthesiologist, CRNA and Surgeon  Anesthesia Plan Comments:         Anesthesia Quick Evaluation

## 2014-11-28 NOTE — H&P (Signed)
David Aguirre is an 39 y.o. male.     Chief Complaint: Sudden onset upper abdominal pain   HPI:    This is a 39 year old otherwise healthy white male who began having upper abdominal pain which radiated into his right upper quadrant aching acutely at 11:00am this Saturday (yesterday). The patient's had no nausea no vomiting.   Pain radiated to his right shoulder blade and mid back.     In reviewing his past medical history for the last several years has been having intermittent postprandial abdominal pain and similar fashion.  He was told he may very well have gallstones. An ultrasound obtained in 2013 demonstrated possible sludge versus polyp in the gallbladder.   Upon presentation to the emergency room the patient was treated with intravenous narcotics. Ultrasound of the right upper quadrant has been performed and reviewed.  There appears to be stones lodged within the gallbladder neck. Common bile duct was 6 mm. Surgical services were asked to consult.  Past medical history significant for low testosterone and fatigue.  Past Surgical History  Procedure Laterality Date  . Tonsillectomy    . Nasal septum surgery    . Turbinate reduction Bilateral   . Nasal sinus surgery      Family History  Problem Relation Age of Onset  . Diabetes Mother   . Hyperlipidemia Mother   . Hyperlipidemia Father   . Prostate cancer Paternal Grandfather    Social History:  reports that he quit smoking about 16 years ago. He does not have any smokeless tobacco history on file. He reports that he drinks alcohol. He reports that he does not use illicit drugs.  Allergies: No Known Allergies   Review of Systems:   Review of Systems  Constitutional: Positive for malaise/fatigue. Negative for fever, chills and weight loss.  Eyes: Negative.   Respiratory: Negative for cough and hemoptysis.   Cardiovascular: Negative for chest pain and palpitations.  Gastrointestinal: Positive for abdominal pain.  Negative for heartburn, nausea, vomiting, diarrhea, constipation, blood in stool and melena.  Musculoskeletal: Negative.   Skin: Negative for itching and rash.  Neurological: Negative.  Negative for headaches.  Endo/Heme/Allergies: Negative.     Physical Exam:  Physical Exam  Constitutional: He is oriented to person, place, and time and well-developed, well-nourished, and in no distress. No distress.  HENT:  Head: Normocephalic and atraumatic.  Eyes: Conjunctivae are normal. Pupils are equal, round, and reactive to light.  Neck: No tracheal deviation present. No thyromegaly present.  Cardiovascular: Normal rate, regular rhythm and normal heart sounds.   Pulmonary/Chest: Breath sounds normal. No respiratory distress. He has no wheezes. He has no rales. He exhibits no tenderness.  Abdominal: Soft. Bowel sounds are normal. He exhibits no distension and no mass. There is tenderness. There is no rebound and no guarding. No hernia.    Musculoskeletal: Normal range of motion.  Neurological: He is oriented to person, place, and time.  Skin: Skin is warm and dry. He is not diaphoretic.  Psychiatric: Mood, memory, affect and judgment normal.    Blood pressure 134/83, pulse 83, temperature 97.8 F (36.6 C), temperature source Oral, resp. rate 20, height 6' (1.829 m), weight 248 lb (112.492 kg), SpO2 98 %.  Results for orders placed or performed during the hospital encounter of 11/27/14 (from the past 48 hour(s))  Troponin I     Status: None   Collection Time: 11/26/14 10:30 PM  Result Value Ref Range   Troponin I <0.03 <0.031 ng/mL  Comment:        NO INDICATION OF MYOCARDIAL INJURY.   CBC WITH DIFFERENTIAL     Status: Abnormal   Collection Time: 11/27/14  6:57 PM  Result Value Ref Range   WBC 12.5 (H) 3.8 - 10.6 K/uL   RBC 5.57 4.40 - 5.90 MIL/uL   Hemoglobin 16.1 13.0 - 18.0 g/dL   HCT 48.6 40.0 - 52.0 %   MCV 87.3 80.0 - 100.0 fL   MCH 28.9 26.0 - 34.0 pg   MCHC 33.1 32.0 -  36.0 g/dL   RDW 13.9 11.5 - 14.5 %   Platelets 229 150 - 440 K/uL   Neutrophils Relative % 83 %   Neutro Abs 10.3 (H) 1.4 - 6.5 K/uL   Lymphocytes Relative 11 %   Lymphs Abs 1.4 1.0 - 3.6 K/uL   Monocytes Relative 6 %   Monocytes Absolute 0.7 0.2 - 1.0 K/uL   Eosinophils Relative 1 %   Eosinophils Absolute 0.1 0 - 0.7 K/uL   Basophils Relative 1 %   Basophils Absolute 0.1 0 - 0.1 K/uL  Comprehensive metabolic panel     Status: Abnormal   Collection Time: 11/27/14  6:57 PM  Result Value Ref Range   Sodium 135 135 - 145 mmol/L   Potassium 4.0 3.5 - 5.1 mmol/L   Chloride 100 (L) 101 - 111 mmol/L   CO2 27 22 - 32 mmol/L   Glucose, Bld 116 (H) 65 - 99 mg/dL   BUN 18 6 - 20 mg/dL   Creatinine, Ser 1.20 0.61 - 1.24 mg/dL   Calcium 9.2 8.9 - 10.3 mg/dL   Total Protein 6.7 6.5 - 8.1 g/dL   Albumin 4.2 3.5 - 5.0 g/dL   AST 25 15 - 41 U/L   ALT 23 17 - 63 U/L   Alkaline Phosphatase 36 (L) 38 - 126 U/L   Total Bilirubin 0.9 0.3 - 1.2 mg/dL   GFR calc non Af Amer >60 >60 mL/min   GFR calc Af Amer >60 >60 mL/min    Comment: (NOTE) The eGFR has been calculated using the CKD EPI equation. This calculation has not been validated in all clinical situations. eGFR's persistently <60 mL/min signify possible Chronic Kidney Disease.    Anion gap 8 5 - 15  Lipase, blood     Status: None   Collection Time: 11/27/14  6:57 PM  Result Value Ref Range   Lipase 36 22 - 51 U/L  Urinalysis complete, with microscopic ([facility] only)     Status: Abnormal   Collection Time: 11/27/14  6:57 PM  Result Value Ref Range   Color, Urine STRAW (A) YELLOW   APPearance CLEAR (A) CLEAR   Glucose, UA NEGATIVE NEGATIVE mg/dL   Bilirubin Urine NEGATIVE NEGATIVE   Ketones, ur NEGATIVE NEGATIVE mg/dL   Specific Gravity, Urine 1.013 1.005 - 1.030   Hgb urine dipstick NEGATIVE NEGATIVE   pH 5.0 5.0 - 8.0   Protein, ur NEGATIVE NEGATIVE mg/dL   Nitrite NEGATIVE NEGATIVE   Leukocytes, UA NEGATIVE NEGATIVE   RBC / HPF  0-5 0 - 5 RBC/hpf   WBC, UA 0-5 0 - 5 WBC/hpf   Bacteria, UA NONE SEEN NONE SEEN   Squamous Epithelial / LPF NONE SEEN NONE SEEN   US Abdomen Limited Ruq  11/27/2014   CLINICAL DATA:  Right upper quadrant and epigastric pain since this morning  EXAM: US ABDOMEN LIMITED - RIGHT UPPER QUADRANT  COMPARISON:  11/22/2011  FINDINGS: Gallbladder:  Shadowing echogenic structure in the gallbladder neck consistent with stone, nonmobile with decubitus imaging. No wall thickening or focal tenderness to suggest acute cholecystitis.  Common bile duct:  Diameter: 6 mm  Liver:  Stable 9 mm echogenic liver lesion in the right lobe, most consistent with hemangioma. Antegrade flow in the imaged portal venous system.  IMPRESSION: 1. Stone in the gallbladder neck. No evidence of acute cholecystitis. 2. Stable 9 mm right liver hemangioma.   Electronically Signed   By: Monte Fantasia M.D.   On: 11/27/2014 23:19   I personally reviewed the ultrasound images and reviewed the laboratory data.  Assessment/Plan This is a 39 year old white male with history of biliary colic and what appears to be a stone lodged within the gallbladder neck with acute cholecystitis and evolving hydrops. I discussed with him and his spouse present admission to the hospital with intravenous hydration and antibiotics and reassessment in the morning. I suspect that he will require laparoscopic cholecystectomy. I have talked to the operating room regarding adding him as a case to Sunday morning. Certainly if he is feeling much better he can be discharged with return as an outpatient for elective cholecystectomy at a later time. He has a family funeral next Saturday that he is concerned that he may miss.   Hortencia Conradi, MD, FACS

## 2014-11-28 NOTE — Anesthesia Procedure Notes (Signed)
Procedure Name: Intubation Date/Time: 11/28/2014 12:43 PM Performed by: Kallie Edward Pre-anesthesia Checklist: Patient identified Patient Re-evaluated:Patient Re-evaluated prior to inductionOxygen Delivery Method: Circle system utilized Preoxygenation: Pre-oxygenation with 100% oxygen Intubation Type: IV induction Ventilation: Mask ventilation without difficulty Laryngoscope Size: Mac and 4 Grade View: Grade II Tube type: Oral Tube size: 7.0 mm Number of attempts: 2 Airway Equipment and Method: Stylet Placement Confirmation: ETT inserted through vocal cords under direct vision,  positive ETCO2 and breath sounds checked- equal and bilateral

## 2014-11-28 NOTE — Progress Notes (Signed)
Preoperative Review   Patient is met in the preoperative holding area. The history is reviewed in the chart and with the patient. I personally reviewed the options and rationale as well as the risks of this procedure that have been previously discussed with the patient. All questions asked by the patient and/or family were answered to their satisfaction.  Patient agrees to proceed with this procedure at this time.  Donterius Filley E Nkosi Cortright M.D. FACS  

## 2014-11-28 NOTE — Discharge Instructions (Signed)
Remove dressing in 24 hours. °May shower in 24 hours. °Leave paper strips in place. °Resume all home medications. °Follow-up with Dr. Deneene Tarver in 10 days. °

## 2014-11-28 NOTE — Progress Notes (Signed)
Patient feels better this morning but is still in pain and nauseated. History is reviewed with the chart and with Dr. Marina Gravel. Physical exam reveals continued abdominal tenderness in the right upper quadrant no scleral icterus  Workup is reviewed and I agree with planning laparoscopic cholecystectomy. I discussed with he and his wife the options of observation and discharge with follow-up as an outpatient versus laparoscopic cholecystectomy for today for control of his symptoms. These options were reviewed in detail and the risks of bleeding infection bowel injury bile duct injury or bile duct leak retained common bile duct stone or conversion to an open procedure all of which were reviewed the understood and agreed to proceed with surgery today.

## 2014-11-28 NOTE — Anesthesia Postprocedure Evaluation (Signed)
  Anesthesia Post-op Note  [name]  Procedure(s) Performed: Procedure(s): LAPAROSCOPIC CHOLECYSTECTOMY WITH INTRAOPERATIVE CHOLANGIOGRAM (N/A)  Anesthesia type:General ETT  Patient location: PACU  Post pain: Pain level controlled  Post assessment: Post-op Vital signs reviewed, Patient's Cardiovascular Status Stable, Respiratory Function Stable, Patent Airway and No signs of Nausea or vomiting  Post vital signs: Reviewed and stable  Last Vitals:  Filed Vitals:   11/28/14 1432  BP: 134/82  Pulse: 68  Temp: 36.7 C  Resp: 20    Level of consciousness: awake, alert  and patient cooperative  Complications: No apparent anesthesia complications

## 2014-11-28 NOTE — Transfer of Care (Signed)
Immediate Anesthesia Transfer of Care Note  [name]  Procedure(s) Performed: Procedure(s): LAPAROSCOPIC CHOLECYSTECTOMY WITH INTRAOPERATIVE CHOLANGIOGRAM (N/A)  Patient Location: PACU  Anesthesia Type:General  Level of Consciousness: awake  Airway & Oxygen Therapy: Patient Spontanous Breathing  Post-op Assessment: Report given to RN  Post vital signs: Reviewed  Last Vitals:  Filed Vitals:   11/28/14 1136  BP: 146/83  Pulse: 79  Temp: 36.6 C  Resp:     Complications: No apparent anesthesia complications

## 2014-11-28 NOTE — Op Note (Signed)
Laparoscopic Cholecystectomy  Pre-operative Diagnosis: Acute cholecystitis  Post-operative Diagnosis: Acute cholecystitis  Procedure: Laparoscopic cholecystectomy  Surgeon: Jerrol Banana. Burt Knack, MD FACS  Anesthesia: Gen. with endotracheal tube  Assistant: Surgical tech  Procedure Details  The patient was seen again in the Holding Room. The benefits, complications, treatment options, and expected outcomes were discussed with the patient. The risks of bleeding, infection, recurrence of symptoms, failure to resolve symptoms, bile duct damage, bile duct leak, retained common bile duct stone, bowel injury, any of which could require further surgery and/or ERCP, stent, or papillotomy were reviewed with the patient. The likelihood of improving the patient's symptoms with return to their baseline status is good.  The patient and/or family concurred with the proposed plan, giving informed consent.  The patient was taken to Operating Room, identified as David Aguirre and the procedure verified as Laparoscopic Cholecystectomy.  A Time Out was held and the above information confirmed.  Prior to the induction of general anesthesia, antibiotic prophylaxis was administered. VTE prophylaxis was in place. General endotracheal anesthesia was then administered and tolerated well. After the induction, the abdomen was prepped with Chloraprep and draped in the sterile fashion. The patient was positioned in the supine position.  Local anesthetic  was injected into the skin near the umbilicus and an incision made. The Veress needle was placed. Pneumoperitoneum was then created with CO2 and tolerated well without any adverse changes in the patient's vital signs. A 22mm port was placed in the periumbilical position and the abdominal cavity was explored.  Two 5-mm ports were placed in the right upper quadrant and a 12 mm epigastric port was placed all under direct vision. All skin incisions  were infiltrated with a local  anesthetic agent before making the incision and placing the trocars.   The patient was positioned  in reverse Trendelenburg, tilted slightly to the patient's left.  The gallbladder was identified, the fundus grasped and retracted cephalad. Adhesions were lysed bluntly. The infundibulum was grasped and retracted laterally, exposing the peritoneum overlying the triangle of Calot. This was then divided and exposed in a blunt fashion. A critical view of the cystic duct and cystic artery was obtained.  The cystic duct was clearly identified and bluntly dissected.   The cystic duct was found to have a impacted stone in the infundibulum here it was doubly clipped and divided. The cystic artery was doubly clipped and divided  The gallbladder was taken from the gallbladder fossa in a retrograde fashion with the electrocautery. The gallbladder was removed and placed in an Endocatch bag. The liver bed was irrigated and inspected. Hemostasis was achieved with the electrocautery. Copious irrigation was utilized and was repeatedly aspirated until clear.  The gallbladder and Endocatch sac were then removed through the epigastric port site.    Inspection of the right upper quadrant was performed. No bleeding, bile duct injury or leak, or bowel injury was noted. Pneumoperitoneum was released.  The epigastric port site was closed with figure-of-eight 0 Vicryl sutures. 4-0 subcuticular Monocryl was used to close the skin. Steristrips and Mastisol and sterile dressings were  applied.  The patient was then extubated and brought to the recovery room in stable condition. Sponge, lap, and needle counts were correct at closure and at the conclusion of the case.   Findings: Acute Cholecystitis   Estimated Blood Loss: 25 cc         Drains: None         Specimens: Gallbladder  Complications: none               Jovi Alvizo E. Burt Knack, MD, FACS

## 2014-11-29 ENCOUNTER — Encounter: Payer: Self-pay | Admitting: Surgery

## 2014-11-29 LAB — CBC WITH DIFFERENTIAL/PLATELET
Basophils Absolute: 0 10*3/uL (ref 0–0.1)
Basophils Relative: 0 %
EOS ABS: 0 10*3/uL (ref 0–0.7)
EOS PCT: 0 %
HCT: 45.6 % (ref 40.0–52.0)
Hemoglobin: 15.1 g/dL (ref 13.0–18.0)
LYMPHS ABS: 1.3 10*3/uL (ref 1.0–3.6)
Lymphocytes Relative: 13 %
MCH: 28.7 pg (ref 26.0–34.0)
MCHC: 33.1 g/dL (ref 32.0–36.0)
MCV: 86.7 fL (ref 80.0–100.0)
MONO ABS: 0.7 10*3/uL (ref 0.2–1.0)
MONOS PCT: 7 %
Neutro Abs: 8.2 10*3/uL — ABNORMAL HIGH (ref 1.4–6.5)
Neutrophils Relative %: 80 %
PLATELETS: 217 10*3/uL (ref 150–440)
RBC: 5.26 MIL/uL (ref 4.40–5.90)
RDW: 13.8 % (ref 11.5–14.5)
WBC: 10.2 10*3/uL (ref 3.8–10.6)

## 2014-11-29 LAB — COMPREHENSIVE METABOLIC PANEL
ALT: 37 U/L (ref 17–63)
ANION GAP: 6 (ref 5–15)
AST: 47 U/L — ABNORMAL HIGH (ref 15–41)
Albumin: 3.9 g/dL (ref 3.5–5.0)
Alkaline Phosphatase: 35 U/L — ABNORMAL LOW (ref 38–126)
BUN: 10 mg/dL (ref 6–20)
CHLORIDE: 103 mmol/L (ref 101–111)
CO2: 30 mmol/L (ref 22–32)
Calcium: 8.6 mg/dL — ABNORMAL LOW (ref 8.9–10.3)
Creatinine, Ser: 1.1 mg/dL (ref 0.61–1.24)
GFR calc non Af Amer: >60 mL/min (ref >60)
Glucose, Bld: 104 mg/dL — ABNORMAL HIGH (ref 65–99)
POTASSIUM: 4.4 mmol/L (ref 3.5–5.1)
SODIUM: 139 mmol/L (ref 135–145)
Total Bilirubin: 1.2 mg/dL (ref 0.3–1.2)
Total Protein: 6.1 g/dL — ABNORMAL LOW (ref 6.5–8.1)

## 2014-11-29 MED ORDER — OXYCODONE-ACETAMINOPHEN 5-325 MG PO TABS: 1.0000 | ORAL_TABLET | ORAL | Status: DC | PRN

## 2014-11-29 NOTE — Progress Notes (Signed)
Alert and oriented. Vss. No signs of acute distress. Tolerating diet. No nausea and vomiting observed. Discharge instructions given. Patient verbalized understanding.

## 2014-11-29 NOTE — Discharge Summary (Signed)
Physician Discharge Summary  Patient ID: David Aguirre MRN: [id number] DOB/AGE: 09/20/1975 39 y.o.  Admit date: 11/27/2014 Discharge date: 11/29/2014  Admission Diagnoses:  Discharge Diagnoses:  Active Problems:   Recurrent biliary colic   Discharged Condition: good  Hospital Course: [name] was admitted with recurrent RUQ pain and gallstones.  He underwent unremarkable laparoscopic cholecystectomy.  Postoperatively his diet was advanced and he was transitioned to PO pain medications.  At time of discharge he was tolerating a regular diet with good PO pain control  Consults: None  Treatments:  Laparoscopic cholecystectomy 10/2  Discharge Exam: Blood pressure 128/75, pulse 70, temperature 97.6 F (36.4 C), temperature source Oral, resp. rate 20, height 6' (1.829 m), weight 248 lb (112.492 kg), SpO2 96 %. GEN: NAD/A&Ox3 ABD: soft, min tender, nondistended, dressings c/d/i  Disposition: to home     Medication List    TAKE these medications        MULTIPLE VITAMIN PO  Take by mouth.     oxyCODONE-acetaminophen 5-325 MG tablet  Commonly known as:  ROXICET  Take 1 tablet by mouth every 4 (four) hours as needed for moderate pain.     oxyCODONE-acetaminophen 5-325 MG tablet  Commonly known as:  ROXICET  Take 1-2 tablets by mouth every 4 (four) hours as needed for moderate pain.     phentermine 37.5 MG tablet  Commonly known as:  ADIPEX-P  Take 1 tablet (37.5 mg total) by mouth daily before breakfast.     Testosterone 20.25 MG/ACT (1.62%) Gel  Commonly known as:  ANDROGEL PUMP  Place 4 Units onto the skin daily. 4 pumps daily QS           Follow-up Information    Follow up with Phoebe Perch, MD In 10 days.   Specialty:  Surgery   Why:  For wound re-check   Contact information:   [address] [place] [address] [phone]       Signed: Marlyce Huge 11/29/2014, 11:55 AM

## 2014-11-29 NOTE — Progress Notes (Signed)
Surgery Progress Note  S: Feels good.  Some RUQ pain.  Tolerating liquids O:Blood pressure 128/75, pulse 70, temperature 97.6 F (36.4 C), temperature source Oral, resp. rate 20, height 6' (1.829 m), weight 248 lb (112.492 kg), SpO2 96 %. GEN: NAD/A&Ox3 ABD: soft, min tender, nondistended, dressings c/d/i  A/P 39 yo s/p lap chole, doing well - regular diet - PO pain meds - possible home later

## 2014-11-30 LAB — SURGICAL PATHOLOGY

## 2014-12-02 ENCOUNTER — Telehealth: Payer: Self-pay

## 2014-12-02 NOTE — Telephone Encounter (Signed)
Patient was scheduled too soon as post-op. Spoke with him at this time- he would like to move this appointment to next week. This was done. Patient will see Dr. Adonis Huguenin next week in [facility] office.  Denies any problems or concerns at this time.

## 2014-12-03 ENCOUNTER — Ambulatory Visit: Payer: Managed Care, Other (non HMO) | Admitting: Surgery

## 2014-12-08 ENCOUNTER — Encounter: Payer: Self-pay | Admitting: General Surgery

## 2014-12-08 ENCOUNTER — Ambulatory Visit (INDEPENDENT_AMBULATORY_CARE_PROVIDER_SITE_OTHER): Payer: Managed Care, Other (non HMO) | Admitting: General Surgery

## 2014-12-08 VITALS — BP 137/90 | HR 70 | Temp 98.3°F | Ht 72.0 in | Wt 247.0 lb

## 2014-12-08 DIAGNOSIS — Z4889 Encounter for other specified surgical aftercare: Secondary | ICD-10-CM

## 2014-12-08 NOTE — Patient Instructions (Signed)
Follow up in our office as needed.

## 2014-12-08 NOTE — Progress Notes (Signed)
Outpatient Surgical Follow Up  12/08/2014  David Aguirre is an 39 y.o. male.   Chief Complaint  Patient presents with  . follow up Lap Cholecystectomy    HPI: 39 year old male 10 days status post laparoscopic cholecystectomy. Patient reports having minimal pain. He's been eating well and having normal bowel function. His only area of discomfort from his upper midline port but this is improving daily. Denies any fevers, chills, nausea, vomiting, diarrhea, constipation.  Past Medical History  Diagnosis Date  . Low testosterone     Past Surgical History  Procedure Laterality Date  . Tonsillectomy    . Nasal septum surgery    . Turbinate reduction Bilateral   . Nasal sinus surgery    . Cholecystectomy N/A 11/28/2014    Procedure: LAPAROSCOPIC CHOLECYSTECTOMY WITH INTRAOPERATIVE CHOLANGIOGRAM;  Surgeon: Florene Glen, MD;  Location: [facility] ORS;  Service: General;  Laterality: N/A;    Family History  Problem Relation Age of Onset  . Diabetes Mother   . Hyperlipidemia Mother   . Hyperlipidemia Father   . Prostate cancer Paternal Grandfather     Social History:  reports that he quit smoking about 16 years ago. His smokeless tobacco use includes Snuff. He reports that he drinks alcohol. He reports that he does not use illicit drugs.  Allergies: No Known Allergies  Medications reviewed.    ROS A multipoint review of systems was completed. All pertinent positives negatives within the history of present illness remainder were negative.   BP 137/90 mmHg  Pulse 70  Temp(Src) 98.3 F (36.8 C) (Oral)  Ht 6' (1.829 m)  Wt 112.038 kg (247 lb)  BMI 33.49 kg/m2  Physical Exam Gen.: No acute distress Chest: Clear to auscultation without any excess history muscle usage Heart: Regular rate and rhythm Abdomen: Soft, nontender, nondistended. Well approximated lap scopic cholecystectomy sites of the evidence of infection or drainage.    No results found for this or any  previous visit (from the past 48 hour(s)). No results found. Pathology results consistent with chronic cholecystitis Assessment/Plan:  1. Aftercare following surgery 39 year old male 10 days status post laparoscopic cholecystectomy. Discussed timeframe for returning to work. 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 We'll plan for patient to return to work on a light duty status for the next month after which he can return to full active duty. Discussed signs and symptoms of infection with the patient and informed him to follow up should they occur. Otherwise patient follow up when necessary. Doing well.     Clayburn Pert  12/08/2014,negative

## 2014-12-16 ENCOUNTER — Encounter: Payer: Self-pay | Admitting: *Deleted

## 2014-12-16 ENCOUNTER — Telehealth: Payer: Self-pay | Admitting: General Surgery

## 2014-12-16 NOTE — Telephone Encounter (Signed)
Patient called and needs his work note changed so he can return to work, his work isn't allowing him to return due to the wording of the letter

## 2014-12-16 NOTE — Telephone Encounter (Signed)
Returned patient call. Patient needs his disability letter rewritten for his employer. Disability letter rewritten and available for pick up at the [facility] office front desk. Patient confirmed understanding of information.

## 2015-03-09 ENCOUNTER — Ambulatory Visit: Payer: Managed Care, Other (non HMO) | Admitting: Family Medicine

## 2015-03-10 ENCOUNTER — Ambulatory Visit (INDEPENDENT_AMBULATORY_CARE_PROVIDER_SITE_OTHER): Payer: Managed Care, Other (non HMO) | Admitting: Family Medicine

## 2015-03-10 ENCOUNTER — Encounter: Payer: Self-pay | Admitting: Family Medicine

## 2015-03-10 VITALS — BP 118/76 | HR 64 | Temp 97.7°F | Resp 14 | Wt 253.0 lb

## 2015-03-10 DIAGNOSIS — E291 Testicular hypofunction: Secondary | ICD-10-CM

## 2015-03-10 DIAGNOSIS — E669 Obesity, unspecified: Secondary | ICD-10-CM

## 2015-03-10 DIAGNOSIS — Z87891 Personal history of nicotine dependence: Secondary | ICD-10-CM | POA: Diagnosis not present

## 2015-03-10 MED ORDER — TESTOSTERONE 20.25 MG/ACT (1.62%) TD GEL
4.0000 [IU] | Freq: Every day | TRANSDERMAL | Status: DC
Start: 2015-03-10 — End: 2015-12-15

## 2015-03-10 NOTE — Progress Notes (Signed)
Patient ID: David Aguirre, male   DOB: 20-Sep-1975, 40 y.o.   MRN: [id number]    Subjective:  HPI  Patient is here for 6 months follow up. Hypogonadism: Patient ran out of his Androgel at his last office visit but has restarted using it since then. When he does not use Androgel he develops symptoms of decreased libido and lethargic. He has been doing fine since been back on medication. We checked his Testosterone and Prolactin in July but that was when he has been off the medication. Levels were Testosterone Free 8.6 and Prolactin 7.0 Patient snores but no documented apnea. Prior to Admission medications   Medication Sig Start Date End Date Taking? Authorizing Provider  MULTIPLE VITAMIN PO Take by mouth.   Yes Historical Provider, MD  Testosterone (ANDROGEL PUMP) 20.25 MG/ACT (1.62%) GEL Place 4 Units onto the skin daily. 4 pumps daily QS 09/16/14  Yes Richard Maceo Pro., MD  phentermine (ADIPEX-P) 37.5 MG tablet Take 1 tablet (37.5 mg total) by mouth daily before breakfast. Patient not taking: Reported on 03/10/2015 11/05/14   Jerrol Banana., MD    Patient Active Problem List   Diagnosis Date Noted  . Aftercare following surgery 12/08/2014  . 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 Clinical depression 09/15/2014  . Acid reflux 09/15/2014  . 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 History of tobacco use 09/15/2014  . 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 Low testosterone     Social History   Social History  . Marital Status: Single    Spouse Name: N/A  . Number of Children: N/A  . Years of Education: N/A   Occupational History  . Not on file.   Social History Main Topics  . Smoking status: Former Smoker    Quit date: 02/26/1998  . Smokeless tobacco: Current User    Types: Snuff  . Alcohol Use: Yes     Comment: 2-3 times a week  . Drug Use: No  . Sexual Activity: Not on file   Other Topics Concern  . Not  on file   Social History Narrative    No Known Allergies  Review of Systems  Constitutional: Negative.   Respiratory: Negative.   Cardiovascular: Negative.   Musculoskeletal: Negative.      There is no immunization history on file for this patient. Objective:  BP 118/76 mmHg  Pulse 64  Temp(Src) 97.7 F (36.5 C)  Resp 14  Wt 253 lb (114.76 kg)  Physical Exam  Constitutional: He is oriented to person, place, and time and well-developed, well-nourished, and in no distress.  HENT:  Head: Normocephalic and atraumatic.  Eyes: Conjunctivae are normal. Pupils are equal, round, and reactive to light.  Neck: Normal range of motion. Neck supple.  Cardiovascular: Normal rate, regular rhythm, normal heart sounds and intact distal pulses.   Pulmonary/Chest: Effort normal and breath sounds normal.  Musculoskeletal: Normal range of motion. He exhibits no edema or tenderness.  Neurological: He is alert and oriented to person, place, and time. Gait normal.  Skin: Skin is warm and dry.  Psychiatric: Mood, memory, affect and judgment normal.    Lab Results  Component Value Date   WBC 10.2 11/29/2014   HGB 15.1 11/29/2014   HCT 45.6 11/29/2014   PLT 217 11/29/2014   GLUCOSE 104* 11/29/2014   CHOL 164 09/16/2014   TRIG 74 09/16/2014   HDL 44 09/16/2014   LDLCALC 105* 09/16/2014  TSH 1.210 09/16/2014   PSA 1.6 09/11/2012   INR 0.90 12/26/2008    CMP     Component Value Date/Time   NA 139 11/29/2014 0628   NA 141 09/16/2014 0936   K 4.4 11/29/2014 0628   CL 103 11/29/2014 0628   CO2 30 11/29/2014 0628   GLUCOSE 104* 11/29/2014 0628   GLUCOSE 92 09/16/2014 0936   BUN 10 11/29/2014 0628   BUN 25* 09/16/2014 0936   CREATININE 1.10 11/29/2014 0628   CREATININE 1.4* 09/11/2012   CALCIUM 8.6* 11/29/2014 0628   PROT 6.1* 11/29/2014 0628   PROT 6.7 09/16/2014 0936   ALBUMIN 3.9 11/29/2014 0628   ALBUMIN 4.7 09/16/2014 0936   AST 47* 11/29/2014 0628   ALT 37 11/29/2014 0628    ALKPHOS 35* 11/29/2014 0628   BILITOT 1.2 11/29/2014 0628   BILITOT 0.5 09/16/2014 0936   GFRNONAA >60 11/29/2014 0628   GFRAA >60 11/29/2014 0628    Assessment and Plan :  1. Hypogonadism male Doing well on this treatment. Refill provided. Advised patient he has to keep his 6 months follow up, if he can not to do that will refer him out to urology. Patient understood. Will recheck levels today also. - Testosterone - Testosterone (ANDROGEL PUMP) 20.25 MG/ACT (1.62%) GEL; Place 4 Units onto the skin daily. 4 pumps daily QS  Dispense: 150 g; Refill: 5  2. Adiposity  3. History of tobacco use    Miguel Aschoff MD [facility] 03/10/2015 8:09 AM

## 2015-03-11 ENCOUNTER — Telehealth: Payer: Self-pay

## 2015-03-11 LAB — TESTOSTERONE: TESTOSTERONE: 219 ng/dL — AB (ref 348–1197)

## 2015-03-11 NOTE — Telephone Encounter (Signed)
[id number] 

## 2015-03-11 NOTE — Telephone Encounter (Signed)
-----   Message from Jerrol Banana., MD sent at 03/11/2015  3:58 PM EST ----- Testosterone low. Would use AndroGel daily and recheck a level in 3 months  To 61months.

## 2015-03-11 NOTE — Telephone Encounter (Signed)
Pt returned your call ° °David Aguirre °

## 2015-03-15 NOTE — Telephone Encounter (Signed)
Pt advised on voicemail-aa 

## 2015-09-07 ENCOUNTER — Ambulatory Visit: Payer: Managed Care, Other (non HMO) | Admitting: Family Medicine

## 2015-09-21 ENCOUNTER — Ambulatory Visit (INDEPENDENT_AMBULATORY_CARE_PROVIDER_SITE_OTHER): Payer: Managed Care, Other (non HMO) | Admitting: Family Medicine

## 2015-09-21 ENCOUNTER — Encounter: Payer: Self-pay | Admitting: Family Medicine

## 2015-09-21 VITALS — BP 118/76 | HR 66 | Temp 98.5°F | Resp 14 | Wt 258.0 lb

## 2015-09-21 DIAGNOSIS — E669 Obesity, unspecified: Secondary | ICD-10-CM

## 2015-09-21 DIAGNOSIS — E785 Hyperlipidemia, unspecified: Secondary | ICD-10-CM | POA: Diagnosis not present

## 2015-09-21 DIAGNOSIS — Z125 Encounter for screening for malignant neoplasm of prostate: Secondary | ICD-10-CM | POA: Diagnosis not present

## 2015-09-21 DIAGNOSIS — M779 Enthesopathy, unspecified: Secondary | ICD-10-CM | POA: Diagnosis not present

## 2015-09-21 DIAGNOSIS — R739 Hyperglycemia, unspecified: Secondary | ICD-10-CM

## 2015-09-21 DIAGNOSIS — E291 Testicular hypofunction: Secondary | ICD-10-CM

## 2015-09-21 MED ORDER — NAPROXEN 500 MG PO TABS: 500.0000 mg | ORAL_TABLET | Freq: Two times a day (BID) | ORAL | 5 refills | Status: DC

## 2015-09-21 NOTE — Progress Notes (Signed)
Subjective:  HPI  Patient is here for 6 months follow up on hypogonadism. He is using Androgel 4 pumps daily. Symptoms of been lethargic resolved when he is on the medication. His last level was done in January. His routine labs were done in October 2016. Lab Results  Component Value Date   TESTOSTERONE 219 (L) 03/10/2015    Prior to Admission medications   Medication Sig Start Date End Date Taking? Authorizing Provider  MULTIPLE VITAMIN PO Take by mouth.   Yes Historical Provider, MD  Testosterone (ANDROGEL PUMP) 20.25 MG/ACT (1.62%) GEL Place 4 Units onto the skin daily. 4 pumps daily QS 03/10/15  Yes Richard Maceo Pro., MD    Patient Active Problem List   Diagnosis Date Noted  . Aftercare following surgery 12/08/2014  . 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 Clinical depression 09/15/2014  . Acid reflux 09/15/2014  . 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 History of tobacco use 09/15/2014  . 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 Low testosterone     Social History   Social History  . Marital status: Single    Spouse name: N/A  . Number of children: N/A  . Years of education: N/A   Occupational History  . Not on file.   Social History Main Topics  . Smoking status: Former Smoker    Quit date: 02/26/1998  . Smokeless tobacco: Current User    Types: Snuff  . Alcohol use Yes     Comment: 2-3 times a week  . Drug use: No  . Sexual activity: Not on file   Other Topics Concern  . Not on file   Social History Narrative  . No narrative on file    No Known Allergies  Review of Systems  Constitutional: Negative.   Respiratory: Negative.   Cardiovascular: Negative.   Musculoskeletal: Negative.      There is no immunization history on file for this patient. Objective:  BP 118/76   Pulse 66   Temp 98.5 F (36.9 C)   Resp 14   Wt 258 lb (117 kg)   BMI 34.99 kg/m   Physical Exam    Constitutional: He is oriented to person, place, and time and well-developed, well-nourished, and in no distress.  HENT:  Head: Normocephalic and atraumatic.  Right Ear: External ear normal.  Left Ear: External ear normal.  Eyes: Conjunctivae are normal. Pupils are equal, round, and reactive to light.  Neck: Normal range of motion. Neck supple.  Cardiovascular: Normal rate, regular rhythm, normal heart sounds and intact distal pulses.   No murmur heard. Pulmonary/Chest: Effort normal and breath sounds normal. No respiratory distress. He has no wheezes.  Neurological: He is alert and oriented to person, place, and time.    Lab Results  Component Value Date   WBC 10.2 11/29/2014   HGB 15.1 11/29/2014   HCT 45.6 11/29/2014   PLT 217 11/29/2014   GLUCOSE 104 (H) 11/29/2014   CHOL 164 09/16/2014   TRIG 74 09/16/2014   HDL 44 09/16/2014   LDLCALC 105 (H) 09/16/2014   TSH 1.210 09/16/2014   PSA 1.6 09/11/2012   INR 0.90 12/26/2008    CMP     Component Value Date/Time   NA 139 11/29/2014 0628   NA 141 09/16/2014 0936   K 4.4 11/29/2014 0628   CL 103 11/29/2014 0628   CO2 30 11/29/2014 0628   GLUCOSE 104 (H) 11/29/2014  0628   BUN 10 11/29/2014 0628   BUN 25 (H) 09/16/2014 0936   CREATININE 1.10 11/29/2014 0628   CALCIUM 8.6 (L) 11/29/2014 0628   PROT 6.1 (L) 11/29/2014 0628   PROT 6.7 09/16/2014 0936   ALBUMIN 3.9 11/29/2014 0628   ALBUMIN 4.7 09/16/2014 0936   AST 47 (H) 11/29/2014 0628   ALT 37 11/29/2014 0628   ALKPHOS 35 (L) 11/29/2014 0628   BILITOT 1.2 11/29/2014 0628   BILITOT 0.5 09/16/2014 0936   GFRNONAA >60 11/29/2014 0628   GFRAA >60 11/29/2014 0628    Assessment and Plan :  1. Hypogonadism male Re check levels today after patient has been using his medication regularly. - Testosterone - CBC w/Diff/Platelet - TSH - PSA  2. Adiposity - CBC w/Diff/Platelet - TSH  3. Hyperglycemia - HgB A1c  4. Hyperlipidemia - Comprehensive metabolic panel -  Lipid Panel With LDL/HDL Ratio  5. Prostate cancer screening - PSA  Patient was seen and examined by Dr. Eulas Post and note was scribed by Theressa Millard, RMA.  Miguel Aschoff MD [facility] 09/21/2015 3:47 PM

## 2015-10-15 LAB — CBC WITH DIFFERENTIAL/PLATELET
BASOS ABS: 0 10*3/uL (ref 0.0–0.2)
Basos: 1 %
EOS (ABSOLUTE): 0.2 10*3/uL (ref 0.0–0.4)
Eos: 3 %
HEMOGLOBIN: 16.1 g/dL (ref 12.6–17.7)
Hematocrit: 47.8 % (ref 37.5–51.0)
IMMATURE GRANS (ABS): 0 10*3/uL (ref 0.0–0.1)
IMMATURE GRANULOCYTES: 0 %
LYMPHS: 38 %
Lymphocytes Absolute: 2.1 10*3/uL (ref 0.7–3.1)
MCH: 29.3 pg (ref 26.6–33.0)
MCHC: 33.7 g/dL (ref 31.5–35.7)
MCV: 87 fL (ref 79–97)
MONOCYTES: 7 %
Monocytes Absolute: 0.4 10*3/uL (ref 0.1–0.9)
NEUTROS PCT: 51 %
Neutrophils Absolute: 2.8 10*3/uL (ref 1.4–7.0)
PLATELETS: 248 10*3/uL (ref 150–379)
RBC: 5.5 x10E6/uL (ref 4.14–5.80)
RDW: 13.6 % (ref 12.3–15.4)
WBC: 5.5 10*3/uL (ref 3.4–10.8)

## 2015-10-15 LAB — TSH: TSH: 1.49 u[IU]/mL (ref 0.450–4.500)

## 2015-10-15 LAB — LIPID PANEL WITH LDL/HDL RATIO
Cholesterol, Total: 169 mg/dL (ref 100–199)
HDL: 41 mg/dL (ref >39)
LDL Calculated: 109 mg/dL — ABNORMAL HIGH (ref 0–99)
LDL/HDL RATIO: 2.7 ratio (ref 0.0–3.6)
TRIGLYCERIDES: 93 mg/dL (ref 0–149)
VLDL CHOLESTEROL CAL: 19 mg/dL (ref 5–40)

## 2015-10-15 LAB — COMPREHENSIVE METABOLIC PANEL
ALT: 20 IU/L (ref 0–44)
AST: 15 IU/L (ref 0–40)
Albumin/Globulin Ratio: 2.3 — ABNORMAL HIGH (ref 1.2–2.2)
Albumin: 4.4 g/dL (ref 3.5–5.5)
Alkaline Phosphatase: 34 IU/L — ABNORMAL LOW (ref 39–117)
BILIRUBIN TOTAL: 0.6 mg/dL (ref 0.0–1.2)
BUN/Creatinine Ratio: 14 (ref 9–20)
BUN: 17 mg/dL (ref 6–20)
CHLORIDE: 101 mmol/L (ref 96–106)
CO2: 24 mmol/L (ref 18–29)
CREATININE: 1.23 mg/dL (ref 0.76–1.27)
Calcium: 8.9 mg/dL (ref 8.7–10.2)
GFR calc non Af Amer: 73 mL/min/{1.73_m2} (ref >59)
GFR, EST AFRICAN AMERICAN: 85 mL/min/{1.73_m2} (ref >59)
GLUCOSE: 93 mg/dL (ref 65–99)
Globulin, Total: 1.9 g/dL (ref 1.5–4.5)
Potassium: 4.9 mmol/L (ref 3.5–5.2)
Sodium: 139 mmol/L (ref 134–144)
TOTAL PROTEIN: 6.3 g/dL (ref 6.0–8.5)

## 2015-10-15 LAB — HEMOGLOBIN A1C
Est. average glucose Bld gHb Est-mCnc: 105 mg/dL
Hgb A1c MFr Bld: 5.3 % (ref 4.8–5.6)

## 2015-10-15 LAB — PSA: Prostate Specific Ag, Serum: 1.5 ng/mL (ref 0.0–4.0)

## 2015-10-15 LAB — TESTOSTERONE: TESTOSTERONE: 129 ng/dL — AB (ref 264–916)

## 2015-10-19 LAB — PROLACTIN: Prolactin: 10.9 ng/mL (ref 4.0–15.2)

## 2015-10-19 LAB — SPECIMEN STATUS REPORT

## 2015-12-15 ENCOUNTER — Other Ambulatory Visit: Payer: Self-pay

## 2015-12-15 DIAGNOSIS — E291 Testicular hypofunction: Secondary | ICD-10-CM

## 2015-12-15 MED ORDER — TESTOSTERONE 20.25 MG/ACT (1.62%) TD GEL: 4.0000 [IU] | Freq: Every day | TRANSDERMAL | 3 refills | Status: DC

## 2015-12-15 NOTE — Telephone Encounter (Signed)
Refill request from [facility] for [facility]

## 2016-03-26 ENCOUNTER — Ambulatory Visit (INDEPENDENT_AMBULATORY_CARE_PROVIDER_SITE_OTHER): Payer: Managed Care, Other (non HMO) | Admitting: Family Medicine

## 2016-03-26 VITALS — BP 128/86 | HR 76 | Temp 98.8°F | Resp 14 | Wt 272.0 lb

## 2016-03-26 DIAGNOSIS — Z6836 Body mass index (BMI) 36.0-36.9, adult: Secondary | ICD-10-CM | POA: Diagnosis not present

## 2016-03-26 DIAGNOSIS — E291 Testicular hypofunction: Secondary | ICD-10-CM | POA: Diagnosis not present

## 2016-03-26 DIAGNOSIS — R739 Hyperglycemia, unspecified: Secondary | ICD-10-CM | POA: Insufficient documentation

## 2016-03-26 MED ORDER — TESTOSTERONE 20.25 MG/ACT (1.62%) TD GEL: TRANSDERMAL | 5 refills | Status: DC

## 2016-03-26 NOTE — Progress Notes (Signed)
David Aguirre  MRN: [id number] DOB: Dec 10, 1975  Subjective:  HPI  Patient is here for 6 months follow up. He had labs done on 10/14/15 including Prolactin level and testosterone level.  Patient wanted to mention he had to go to Urgent care for vertigo in November 2017. He was given anti dizziness medication and nausea medication. No symptoms since then. Patient Active Problem List   Diagnosis Date Noted  . Aftercare following surgery 12/08/2014  . 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 Clinical depression 09/15/2014  . Acid reflux 09/15/2014  . 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 History of tobacco use 09/15/2014  . 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 Low testosterone     Social History   Social History  . Marital status: Single    Spouse name: N/A  . Number of children: N/A  . Years of education: N/A   Occupational History  . Not on file.   Social History Main Topics  . Smoking status: Former Smoker    Quit date: 02/26/1998  . Smokeless tobacco: Current User    Types: Snuff  . Alcohol use Yes     Comment: 2-3 times a week  . Drug use: No  . Sexual activity: Not on file   Other Topics Concern  . Not on file   Social History Narrative  . No narrative on file    Outpatient Encounter Prescriptions as of 03/26/2016  Medication Sig Note  . MULTIPLE VITAMIN PO Take by mouth. 09/15/2014: Received from: [facility]  . Testosterone (ANDROGEL PUMP) 20.25 MG/ACT (1.62%) GEL Place 4 Units onto the skin daily. 4 pumps daily QS   . [DISCONTINUED] naproxen (NAPROSYN) 500 MG tablet Take 1 tablet (500 mg total) by mouth 2 (two) times daily with a meal.    No facility-administered encounter medications on file as of 03/26/2016.     No Known Allergies  Review of Systems  Constitutional: Negative.   Respiratory: Negative.   Cardiovascular: Negative.   Musculoskeletal: Negative.   Neurological:  Negative.     Objective:  BP 128/86   Pulse 76   Temp 98.8 F (37.1 C)   Resp 14   Wt 272 lb (123.4 kg)   BMI 36.89 kg/m   Physical Exam  Constitutional: He is oriented to person, place, and time and well-developed, well-nourished, and in no distress.  HENT:  Head: Normocephalic and atraumatic.  Eyes: Conjunctivae are normal. Pupils are equal, round, and reactive to light.  Neck: Normal range of motion. Neck supple.  Cardiovascular: Normal rate, regular rhythm, normal heart sounds and intact distal pulses.   No murmur heard. Pulmonary/Chest: Effort normal and breath sounds normal. No respiratory distress. He has no wheezes.  Musculoskeletal: He exhibits no edema or tenderness.  Neurological: He is alert and oriented to person, place, and time.    Assessment and Plan :  1. Hypogonadism male Refill given. Energy level is good. Re check Testosterone level today since it was low last time, not sure the cause. Pending results.He says he feels good on present dosing. - Testosterone (ANDROGEL PUMP) 20.25 MG/ACT (1.62%) GEL; 4 pumps daily QS  Dispense: 150 g; Refill: 5 - Testosterone  2. BMI 36.0-36.9,adult  HPI, Exam and A&P transcribed under direction and in the presence of Miguel Aschoff, MD. I have done the exam and reviewed the chart and it is accurate to the best of my knowledge. Development worker, community has  been used and  any errors in dictation or transcription are unintentional. Miguel Aschoff M.D. [facility]

## 2016-04-07 LAB — TESTOSTERONE: Testosterone: 392 ng/dL (ref 264–916)

## 2016-04-10 ENCOUNTER — Telehealth: Payer: Self-pay

## 2016-04-10 NOTE — Telephone Encounter (Signed)
-----   Message from Jerrol Banana., MD sent at 04/09/2016  5:01 PM EST ----- Testosterone level better.

## 2016-04-10 NOTE — Telephone Encounter (Signed)
[id number] 

## 2016-04-13 NOTE — Telephone Encounter (Signed)
No answer-aa 

## 2016-04-18 NOTE — Telephone Encounter (Signed)
Advised  ED 

## 2016-09-17 ENCOUNTER — Ambulatory Visit: Payer: Managed Care, Other (non HMO) | Admitting: Family Medicine

## 2016-09-26 ENCOUNTER — Encounter: Payer: Self-pay | Admitting: Family Medicine

## 2016-09-26 ENCOUNTER — Ambulatory Visit (INDEPENDENT_AMBULATORY_CARE_PROVIDER_SITE_OTHER): Payer: PRIVATE HEALTH INSURANCE | Admitting: Family Medicine

## 2016-09-26 VITALS — BP 126/80 | HR 84 | Temp 97.9°F | Resp 16 | Ht 72.0 in | Wt 277.0 lb

## 2016-09-26 DIAGNOSIS — M25562 Pain in left knee: Secondary | ICD-10-CM | POA: Diagnosis not present

## 2016-09-26 DIAGNOSIS — E6609 Other obesity due to excess calories: Secondary | ICD-10-CM | POA: Diagnosis not present

## 2016-09-26 DIAGNOSIS — Z6837 Body mass index (BMI) 37.0-37.9, adult: Secondary | ICD-10-CM | POA: Diagnosis not present

## 2016-09-26 DIAGNOSIS — M25561 Pain in right knee: Secondary | ICD-10-CM | POA: Diagnosis not present

## 2016-09-26 DIAGNOSIS — E291 Testicular hypofunction: Secondary | ICD-10-CM

## 2016-09-26 DIAGNOSIS — R7303 Prediabetes: Secondary | ICD-10-CM

## 2016-09-26 MED ORDER — TESTOSTERONE 20.25 MG/ACT (1.62%) TD GEL: TRANSDERMAL | 5 refills | Status: DC

## 2016-09-26 NOTE — Patient Instructions (Addendum)
Mediterranean Diet A Mediterranean diet refers to food and lifestyle choices that are based on the traditions of countries located on the [place]. This way of eating has been shown to help prevent certain conditions and improve outcomes for people who have chronic diseases, like kidney disease and heart disease. What are tips for following this plan? Lifestyle  Cook and eat meals together with your family, when possible.  Drink enough fluid to keep your urine clear or pale yellow.  Be physically active every day. This includes: ? Aerobic exercise like running or swimming. ? Leisure activities like gardening, walking, or housework.  Get 7-8 hours of sleep each night.  If recommended by your health care provider, drink red wine in moderation. This means 1 glass a day for nonpregnant women and 2 glasses a day for men. A glass of wine equals 5 oz (150 mL). Reading food labels  Check the serving size of packaged foods. For foods such as rice and pasta, the serving size refers to the amount of cooked product, not dry.  Check the total fat in packaged foods. Avoid foods that have saturated fat or trans fats.  Check the ingredients list for added sugars, such as corn syrup. Shopping  At the grocery store, buy most of your food from the areas near the walls of the store. This includes: ? Fresh fruits and vegetables (produce). ? Grains, beans, nuts, and seeds. Some of these may be available in unpackaged forms or large amounts (in bulk). ? Fresh seafood. ? Poultry and eggs. ? Low-fat dairy products.  Buy whole ingredients instead of prepackaged foods.  Buy fresh fruits and vegetables in-season from local farmers markets.  Buy frozen fruits and vegetables in resealable bags.  If you do not have access to quality fresh seafood, buy precooked frozen shrimp or canned fish, such as tuna, salmon, or sardines.  Buy small amounts of raw or cooked vegetables, salads, or olives from the  deli or salad bar at your store.  Stock your pantry so you always have certain foods on hand, such as olive oil, canned tuna, canned tomatoes, rice, pasta, and beans. Cooking  Cook foods with extra-virgin olive oil instead of using butter or other vegetable oils.  Have meat as a side dish, and have vegetables or grains as your main dish. This means having meat in small portions or adding small amounts of meat to foods like pasta or stew.  Use beans or vegetables instead of meat in common dishes like chili or lasagna.  Experiment with different cooking methods. Try roasting or broiling vegetables instead of steaming or sauteing them.  Add frozen vegetables to soups, stews, pasta, or rice.  Add nuts or seeds for added healthy fat at each meal. You can add these to yogurt, salads, or vegetable dishes.  Marinate fish or vegetables using olive oil, lemon juice, garlic, and fresh herbs. Meal planning  Plan to eat 1 vegetarian meal one day each week. Try to work up to 2 vegetarian meals, if possible.  Eat seafood 2 or more times a week.  Have healthy snacks readily available, such as: ? Vegetable sticks with hummus. ? Greek yogurt. ? Fruit and nut trail mix.  Eat balanced meals throughout the week. This includes: ? Fruit: 2-3 servings a day ? Vegetables: 4-5 servings a day ? Low-fat dairy: 2 servings a day ? Fish, poultry, or lean meat: 1 serving a day ? Beans and legumes: 2 or more servings a week ? Nuts   and seeds: 1-2 servings a day ? Whole grains: 6-8 servings a day ? Extra-virgin olive oil: 3-4 servings a day  Limit red meat and sweets to only a few servings a month What are my food choices?  Mediterranean diet ? Recommended ? Grains: Whole-grain pasta. Brown rice. Bulgar wheat. Polenta. Couscous. Whole-wheat bread. Oatmeal. Quinoa. ? Vegetables: Artichokes. Beets. Broccoli. Cabbage. Carrots. Eggplant. Green beans. Chard. Kale. Spinach. Onions. Leeks. Peas. Squash.  Tomatoes. Peppers. Radishes. ? Fruits: Apples. Apricots. Avocado. Berries. Bananas. Cherries. Dates. Figs. Grapes. Lemons. Melon. Oranges. Peaches. Plums. Pomegranate. ? Meats and other protein foods: Beans. Almonds. Sunflower seeds. Pine nuts. Peanuts. Cod. Salmon. Scallops. Shrimp. Tuna. Tilapia. Clams. Oysters. Eggs. ? Dairy: Low-fat milk. Cheese. Greek yogurt. ? Beverages: Water. Red wine. Herbal tea. ? Fats and oils: Extra virgin olive oil. Avocado oil. Grape seed oil. ? Sweets and desserts: Greek yogurt with honey. Baked apples. Poached pears. Trail mix. ? Seasoning and other foods: Basil. Cilantro. Coriander. Cumin. Mint. Parsley. Sage. Rosemary. Tarragon. Garlic. Oregano. Thyme. Pepper. Balsalmic vinegar. Tahini. Hummus. Tomato sauce. Olives. Mushrooms. ? Limit these ? Grains: Prepackaged pasta or rice dishes. Prepackaged cereal with added sugar. ? Vegetables: Deep fried potatoes (french fries). ? Fruits: Fruit canned in syrup. ? Meats and other protein foods: Beef. Pork. Lamb. Poultry with skin. Hot dogs. Bacon. ? Dairy: Ice cream. Sour cream. Whole milk. ? Beverages: Juice. Sugar-sweetened soft drinks. Beer. Liquor and spirits. ? Fats and oils: Butter. Canola oil. Vegetable oil. Beef fat (tallow). Lard. ? Sweets and desserts: Cookies. Cakes. Pies. Candy. ? Seasoning and other foods: Mayonnaise. Premade sauces and marinades. ? The items listed may not be a complete list. Talk with your dietitian about what dietary choices are right for you. Summary  The Mediterranean diet includes both food and lifestyle choices.  Eat a variety of fresh fruits and vegetables, beans, nuts, seeds, and whole grains.  Limit the amount of red meat and sweets that you eat.  Talk with your health care provider about whether it is safe for you to drink red wine in moderation. This means 1 glass a day for nonpregnant women and 2 glasses a day for men. A glass of wine equals 5 oz (150 mL). This information  is not intended to replace advice given to you by your health care provider. Make sure you discuss any questions you have with your health care provider. Document Released: 10/06/2015 Document Revised: 11/08/2015 Document Reviewed: 10/06/2015 Elsevier Interactive Patient Education  2018 [employer] Inc[employer]  

## 2016-09-26 NOTE — Progress Notes (Signed)
[name] Male    DOB: [birth year]   41 y.o.   MRN: [id number] Visit Date: 09/26/2016  Today's Provider: Wilhemena Durie, MD   Chief Complaint  Patient presents with  . Hypogonadism   Subjective:    HPI     Follow up for Hypogonadism  The patient was last seen for this 6 months ago. Changes made at last visit include refilling testosterone. Last level was checked on 04/06/2016 and was 392.  He reports good compliance with treatment. He ran out of the testosterone gel pump in June. He feels that condition is Improved. He is not having side effects.   ------------------------------------------------------------------------------------  Pt is concerned because he is gaining weight. He is unsure of caloric intake. He goes to the gym 3 times a week for 60-90 minutes. He eats 2-3 meals per day. He reports his diet is "closer to well balanced than it used to be". He drinks about 1/2 gallon of water daily. He also drinks coffee (with cream and sugar) and beer, but does not drink sodas or sweet tea.  Wt Readings from Last 3 Encounters:  09/26/16 277 lb (125.6 kg)  03/26/16 272 lb (123.4 kg)  09/21/15 258 lb (117 kg)      No Known Allergies   Current Outpatient Prescriptions:  Marland Kitchen  MULTIPLE VITAMIN PO, Take by mouth., Disp: , Rfl:  .  Testosterone (ANDROGEL PUMP) 20.25 MG/ACT (1.62%) GEL, 4 pumps daily QS (Patient not taking: Reported on 09/26/2016), Disp: 150 g, Rfl: 5  Review of Systems  Constitutional: Positive for diaphoresis and unexpected weight change (gain). Negative for activity change, appetite change, chills, fatigue and fever.  HENT: Negative.   Eyes: Negative.   Respiratory: Negative for shortness of breath.   Cardiovascular: Negative for chest pain, palpitations and leg swelling.  Endocrine: Negative.   Musculoskeletal: Negative.   Allergic/Immunologic: Negative.   Neurological: Negative.   Psychiatric/Behavioral: Negative.     Social  History  Substance Use Topics  . Smoking status: Former Smoker    Quit date: 02/26/1998  . Smokeless tobacco: Current User    Types: Snuff  . Alcohol use Yes     Comment: 2-3 times a week   Objective:   BP 126/80 (BP Location: Left Arm, Patient Position: Sitting, Cuff Size: Large)   Pulse 84   Temp 97.9 F (36.6 C) (Oral)   Resp 16   Ht 6' (1.829 m)   Wt 277 lb (125.6 kg)   BMI 37.57 kg/m  Vitals:   09/26/16 1337  BP: 126/80  Pulse: 84  Resp: 16  Temp: 97.9 F (36.6 C)  TempSrc: Oral  Weight: 277 lb (125.6 kg)  Height: 6' (1.829 m)     Physical Exam  Constitutional: He is oriented to person, place, and time. He appears well-developed and well-nourished.  HENT:  Head: Normocephalic and atraumatic.  Right Ear: External ear normal.  Left Ear: External ear normal.  Nose: Nose normal.  Eyes: Conjunctivae are normal. No scleral icterus.  Neck: Normal range of motion. No thyromegaly present.  Cardiovascular: Normal rate, regular rhythm and normal heart sounds.   Pulmonary/Chest: Effort normal and breath sounds normal. No respiratory distress.  Abdominal: Soft.  Neurological: He is alert and oriented to person, place, and time.  Skin: Skin is warm and dry.  Psychiatric: He has a normal mood and affect. His behavior is normal. Judgment and thought content normal.  Assessment & Plan:     1. Hypogonadism male  - Testosterone (ANDROGEL PUMP) 20.25 MG/ACT (1.62%) GEL; 4 pumps daily QS  Dispense: 150 g; Refill: 5 - PSA  2. Class 2 obesity due to excess calories without serious comorbidity with body mass index (BMI) of 37.0 to 37.9 in adult  - TSH - Lipid panel - CBC with Differential/Platelet - Comprehensive metabolic panel  3. Pain in both knees, unspecified chronicity   4. Prediabetes  - Hemoglobin A1c      I have done the exam and reviewed the above chart and it is accurate to the best of my knowledge. Development worker, community has been used in this note in  any air is in the dictation or transcription are unintentional.  Wilhemena Durie, MD  [place]

## 2016-10-02 ENCOUNTER — Telehealth: Payer: Self-pay | Admitting: Family Medicine

## 2016-10-02 LAB — LIPID PANEL
CHOL/HDL RATIO: 4.4 ratio (ref 0.0–5.0)
CHOLESTEROL TOTAL: 190 mg/dL (ref 100–199)
HDL: 43 mg/dL (ref >39)
LDL Calculated: 126 mg/dL — ABNORMAL HIGH (ref 0–99)
TRIGLYCERIDES: 104 mg/dL (ref 0–149)
VLDL Cholesterol Cal: 21 mg/dL (ref 5–40)

## 2016-10-02 LAB — CBC WITH DIFFERENTIAL/PLATELET
BASOS ABS: 0 10*3/uL (ref 0.0–0.2)
Basos: 1 %
EOS (ABSOLUTE): 0.1 10*3/uL (ref 0.0–0.4)
Eos: 2 %
HEMOGLOBIN: 15.1 g/dL (ref 13.0–17.7)
Hematocrit: 44.7 % (ref 37.5–51.0)
IMMATURE GRANS (ABS): 0 10*3/uL (ref 0.0–0.1)
IMMATURE GRANULOCYTES: 0 %
LYMPHS: 31 %
Lymphocytes Absolute: 1.9 10*3/uL (ref 0.7–3.1)
MCH: 29.3 pg (ref 26.6–33.0)
MCHC: 33.8 g/dL (ref 31.5–35.7)
MCV: 87 fL (ref 79–97)
MONOCYTES: 9 %
Monocytes Absolute: 0.6 10*3/uL (ref 0.1–0.9)
NEUTROS ABS: 3.6 10*3/uL (ref 1.4–7.0)
NEUTROS PCT: 57 %
Platelets: 230 10*3/uL (ref 150–379)
RBC: 5.16 x10E6/uL (ref 4.14–5.80)
RDW: 14.7 % (ref 12.3–15.4)
WBC: 6.3 10*3/uL (ref 3.4–10.8)

## 2016-10-02 LAB — COMPREHENSIVE METABOLIC PANEL
ALBUMIN: 4.3 g/dL (ref 3.5–5.5)
ALT: 24 IU/L (ref 0–44)
AST: 20 IU/L (ref 0–40)
Albumin/Globulin Ratio: 1.8 (ref 1.2–2.2)
Alkaline Phosphatase: 45 IU/L (ref 39–117)
BUN / CREAT RATIO: 14 (ref 9–20)
BUN: 17 mg/dL (ref 6–24)
Bilirubin Total: 0.8 mg/dL (ref 0.0–1.2)
CALCIUM: 9.1 mg/dL (ref 8.7–10.2)
CO2: 23 mmol/L (ref 20–29)
CREATININE: 1.22 mg/dL (ref 0.76–1.27)
Chloride: 103 mmol/L (ref 96–106)
GFR, EST AFRICAN AMERICAN: 85 mL/min/{1.73_m2} (ref >59)
GFR, EST NON AFRICAN AMERICAN: 74 mL/min/{1.73_m2} (ref >59)
Globulin, Total: 2.4 g/dL (ref 1.5–4.5)
Glucose: 103 mg/dL — ABNORMAL HIGH (ref 65–99)
Potassium: 4.5 mmol/L (ref 3.5–5.2)
Sodium: 138 mmol/L (ref 134–144)
TOTAL PROTEIN: 6.7 g/dL (ref 6.0–8.5)

## 2016-10-02 LAB — TSH: TSH: 1.21 u[IU]/mL (ref 0.450–4.500)

## 2016-10-02 LAB — PSA: PROSTATE SPECIFIC AG, SERUM: 1.7 ng/mL (ref 0.0–4.0)

## 2016-10-02 LAB — HEMOGLOBIN A1C
Est. average glucose Bld gHb Est-mCnc: 111 mg/dL
Hgb A1c MFr Bld: 5.5 % (ref 4.8–5.6)

## 2016-10-02 NOTE — Telephone Encounter (Signed)
Patient advised as below. Patient requesting result for testosterone reports that it was suppose to be checked yesterday. Please review. sd   Patient also advised by pharmacy that his Androgel is on hold for PA. Patient just following up on PA.

## 2016-10-02 NOTE — Telephone Encounter (Signed)
Pt is returning call.  XB#[phone]/[id number]

## 2016-10-03 NOTE — Telephone Encounter (Signed)
Added Free and Total Testosterone to pt's labs.   Thanks,   -Mickel Baas

## 2016-10-03 NOTE — Telephone Encounter (Signed)
I don't see a testosterone level drawn on 8/6. I see a PSA which was normal, but not testosterone. Dr. Rosanna Randy needs to complete the PA as he is prescribing this.

## 2016-10-04 NOTE — Telephone Encounter (Signed)
Patient advised of Testosterone levels been added and the number for that. Also just did PA for Androgel for the patient and it is approved through 10/04/17. Will send this information to [place] to let them know about PA approval-aa

## 2016-10-05 ENCOUNTER — Other Ambulatory Visit: Payer: Self-pay | Admitting: Family Medicine

## 2016-10-05 DIAGNOSIS — E291 Testicular hypofunction: Secondary | ICD-10-CM

## 2016-10-05 LAB — TESTOSTERONE,FREE AND TOTAL
TESTOSTERONE: 203 ng/dL — AB (ref 264–916)
Testosterone, Free: 4.6 pg/mL — ABNORMAL LOW (ref 6.8–21.5)

## 2016-10-05 LAB — SPECIMEN STATUS REPORT

## 2017-01-02 ENCOUNTER — Encounter: Payer: Self-pay | Admitting: Family Medicine

## 2017-01-09 ENCOUNTER — Ambulatory Visit (INDEPENDENT_AMBULATORY_CARE_PROVIDER_SITE_OTHER): Payer: PRIVATE HEALTH INSURANCE | Admitting: Family Medicine

## 2017-01-09 ENCOUNTER — Encounter: Payer: Self-pay | Admitting: Family Medicine

## 2017-01-09 VITALS — BP 122/72 | HR 80 | Temp 97.8°F | Resp 16 | Ht 72.0 in | Wt 270.0 lb

## 2017-01-09 DIAGNOSIS — Z0000 Encounter for general adult medical examination without abnormal findings: Secondary | ICD-10-CM

## 2017-01-09 LAB — POCT URINALYSIS DIPSTICK
Bilirubin, UA: NEGATIVE
GLUCOSE UA: NEGATIVE
KETONES UA: NEGATIVE
Leukocytes, UA: NEGATIVE
Nitrite, UA: NEGATIVE
Protein, UA: NEGATIVE
RBC UA: NEGATIVE
SPEC GRAV UA: 1.02 (ref 1.010–1.025)
Urobilinogen, UA: 0.2 E.U./dL
pH, UA: 6 (ref 5.0–8.0)

## 2017-01-09 NOTE — Progress Notes (Signed)
[name], Male    DOB: October 31, 1975, 41 y.o.   MRN: [id number] Visit Date: 01/09/2017  Today's Provider: Wilhemena Durie, MD   Chief Complaint  Patient presents with  . Annual Exam   Subjective:    Annual physical exam ZUHAIR LARICCIA is a 41 y.o. male who presents today for health maintenance and complete physical. He feels fairly well. He reports she is not exercising formally but is very active. He reports he is sleeping well.  -----------------------------------------------------------------    Review of Systems  Constitutional: Negative.   HENT: Positive for congestion, rhinorrhea, sinus pressure and tinnitus.   Eyes: Negative.   Respiratory: Positive for cough.        Snores--Epworth is 9 today.  Cardiovascular: Negative.   Gastrointestinal: Negative.   Endocrine: Negative.   Genitourinary: Positive for difficulty urinating.  Musculoskeletal: Positive for arthralgias and joint swelling.  Skin: Negative.   Allergic/Immunologic: Negative.   Neurological: Negative.   Hematological: Negative.   Psychiatric/Behavioral: Negative.     Social History      He  reports that he quit smoking about 18 years ago. His smokeless tobacco use includes snuff. He reports that he drinks alcohol. He reports that he does not use drugs.       Social History   Socioeconomic History  . Marital status: Married    Spouse name: None  . Number of children: None  . Years of education: None  . Highest education level: None  Social Needs  . Financial resource strain: None  . Food insecurity - worry: None  . Food insecurity - inability: None  . Transportation needs - medical: None  . Transportation needs - non-medical: None  Occupational History  . None  Tobacco Use  . Smoking status: Former Smoker    Last attempt to quit: 02/26/1998    Years since quitting: 18.8  . Smokeless tobacco: Current User    Types: Snuff  Substance and Sexual Activity  .  Alcohol use: Yes    Comment: 2-3 times a week  . Drug use: No  . Sexual activity: None  Other Topics Concern  . None  Social History Narrative  . None    Past Medical History:  Diagnosis Date  . Low testosterone      Patient Active Problem List   Diagnosis Date Noted  . Aftercare following surgery 12/08/2014  . 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 Clinical depression 09/15/2014  . Acid reflux 09/15/2014  . 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 History of tobacco use 09/15/2014  . 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 NASAL SEPTUM SURGERY    . NASAL SINUS SURGERY    . TONSILLECTOMY    . TURBINATE REDUCTION Bilateral     Family History        Family Status  Relation Name Status  . Mother  Alive  . Father  Alive  . Sister  Alive  . Brother  Alive  . PGF  (Not Specified)        His family history includes Diabetes in his mother; Hyperlipidemia in his father and mother; Prostate cancer in his paternal grandfather.     No Known Allergies   Current Outpatient Medications:  .  ANDROGEL PUMP 20.25 MG/ACT (1.62%) GEL, USE 4 PUMPS ONCE DAILY AS DIRECTED, Disp: 150 g, Rfl: 5 .  MULTIPLE VITAMIN PO, Take by mouth., Disp: ,  Rfl:    Patient Care Team: Jerrol Banana., MD as PCP - General (Family Medicine)      Objective:   Vitals: BP 122/72 (BP Location: Left Arm, Patient Position: Sitting, Cuff Size: Large)   Pulse 80   Temp 97.8 F (36.6 C) (Oral)   Resp 16   Ht 6' (1.829 m)   Wt 270 lb (122.5 kg)   BMI 36.62 kg/m    Vitals:   01/09/17 1410  BP: 122/72  Pulse: 80  Resp: 16  Temp: 97.8 F (36.6 C)  TempSrc: Oral  Weight: 270 lb (122.5 kg)  Height: 6' (1.829 m)     Physical Exam   Depression Screen PHQ 2/9 Scores 01/09/2017 09/26/2016  PHQ - 2 Score 0 0  PHQ- 9 Score 3 -      Assessment & Plan:     Routine Health Maintenance and Physical Exam  Exercise Activities  and Dietary recommendations Goals    None       There is no immunization history on file for this patient.  Health Maintenance  Topic Date Due  . HIV Screening  08/11/1991  . TETANUS/TDAP  08/11/1995  . INFLUENZA VACCINE  04/04/2017 (Originally 09/26/2016)     Discussed health benefits of physical activity, and encouraged him to engage in regular exercise appropriate for his age and condition.  Atypical Nevus Refer to dermatology. Hypogonadism    --------------------------------------------------------------------   I have done the exam and reviewed the above chart and it is accurate to the best of my knowledge. Development worker, community has been used in this note in any air is in the dictation or transcription are unintentional.  Wilhemena Durie, MD  [place]

## 2017-01-22 LAB — HEMOCCULT GUIAC POC 1CARD (OFFICE): FECAL OCCULT BLD: NEGATIVE

## 2017-07-10 ENCOUNTER — Ambulatory Visit: Payer: PRIVATE HEALTH INSURANCE | Admitting: Family Medicine

## 2017-07-10 DIAGNOSIS — E291 Testicular hypofunction: Secondary | ICD-10-CM

## 2017-07-10 MED ORDER — TESTOSTERONE 20.25 MG/ACT (1.62%) TD GEL: 4.0000 | TRANSDERMAL | 5 refills | Status: DC

## 2017-07-10 MED ORDER — TESTOSTERONE 20.25 MG/ACT (1.62%) TD GEL
4.0000 | TRANSDERMAL | 5 refills | Status: DC
Start: 2017-07-10 — End: 2017-07-16

## 2017-07-10 NOTE — Progress Notes (Signed)
       [name] Male    DOB: 02-24-1976   42 y.o.   MRN: [id number] Visit Date: 07/10/2017  Today's Provider: Wilhemena Durie, MD   Chief Complaint  Patient presents with  . Hypogonadism   Subjective:    HPI Pt is here today for a 6 month follow up. He reports that he has been out of his Androgel for about 3 weeks now. His fatigue is back and agitation is back per his wife. He says his quality of life is much better on Androgel.    No Known Allergies   Current Outpatient Medications:  .  ANDROGEL PUMP 20.25 MG/ACT (1.62%) GEL, USE 4 PUMPS ONCE DAILY AS DIRECTED (Patient not taking: Reported on 07/10/2017), Disp: 150 g, Rfl: 5 .  MULTIPLE VITAMIN PO, Take by mouth., Disp: , Rfl:   Review of Systems  Constitutional: Positive for fatigue.  HENT: Negative.   Eyes: Negative.   Respiratory: Negative.   Cardiovascular: Negative.   Gastrointestinal: Negative.   Endocrine: Negative.   Genitourinary: Negative.   Musculoskeletal: Negative.   Skin: Negative.   Allergic/Immunologic: Negative.   Neurological: Negative.   Psychiatric/Behavioral: Positive for agitation.    Social History   Tobacco Use  . Smoking status: Former Smoker    Last attempt to quit: 02/26/1998    Years since quitting: 19.3  . Smokeless tobacco: Current User    Types: Snuff  Substance Use Topics  . Alcohol use: Yes    Comment: 2-3 times a week   Objective:   BP 124/82 (BP Location: Left Arm, Patient Position: Sitting, Cuff Size: Large)   Pulse 70   Temp 98.7 F (37.1 C) (Oral)   Resp 16   Wt 280 lb (127 kg)   BMI 37.97 kg/m  Vitals:   07/10/17 1515  BP: 124/82  Pulse: 70  Resp: 16  Temp: 98.7 F (37.1 C)  TempSrc: Oral  Weight: 280 lb (127 kg)     Physical Exam  Constitutional: He is oriented to person, place, and time. He appears well-developed and well-nourished.  HENT:  Head: Normocephalic and atraumatic.  Eyes: Conjunctivae are normal. No scleral icterus.    Neck: No thyromegaly present.  Cardiovascular: Normal rate, regular rhythm and normal heart sounds.  Pulmonary/Chest: Effort normal and breath sounds normal.  Abdominal: Soft.  Musculoskeletal: He exhibits no edema.  Neurological: He is alert and oriented to person, place, and time.  Skin: Skin is warm and dry.  Psychiatric: He has a normal mood and affect. His behavior is normal. Judgment and thought content normal.        Assessment & Plan:     1. Hypogonadism male RTC 6 months. - Testosterone (ANDROGEL PUMP) 20.25 MG/ACT (1.62%) GEL; Apply 4 Pump topically 1 day or 1 dose for 1 dose.  Dispense: 150 g; Refill: 5     I have done the exam and reviewed the chart and it is accurate to the best of my knowledge. Development worker, community has been used and  any errors in dictation or transcription are unintentional. Miguel Aschoff M.D. [place], MD  [facility]

## 2017-07-16 ENCOUNTER — Other Ambulatory Visit: Payer: Self-pay | Admitting: Family Medicine

## 2017-07-16 DIAGNOSIS — E291 Testicular hypofunction: Secondary | ICD-10-CM

## 2017-07-16 NOTE — Telephone Encounter (Signed)
Pharmacy requesting refills. Thanks!  

## 2018-01-09 ENCOUNTER — Encounter: Payer: Self-pay | Admitting: Family Medicine

## 2018-01-09 ENCOUNTER — Ambulatory Visit (INDEPENDENT_AMBULATORY_CARE_PROVIDER_SITE_OTHER): Payer: PRIVATE HEALTH INSURANCE | Admitting: Family Medicine

## 2018-01-09 VITALS — BP 126/88 | HR 64 | Temp 98.1°F | Resp 16 | Ht 72.0 in | Wt 265.0 lb

## 2018-01-09 DIAGNOSIS — Z0000 Encounter for general adult medical examination without abnormal findings: Secondary | ICD-10-CM

## 2018-01-09 DIAGNOSIS — Z125 Encounter for screening for malignant neoplasm of prostate: Secondary | ICD-10-CM | POA: Diagnosis not present

## 2018-01-09 DIAGNOSIS — R7989 Other specified abnormal findings of blood chemistry: Secondary | ICD-10-CM | POA: Diagnosis not present

## 2018-01-09 LAB — POCT URINALYSIS DIPSTICK
Bilirubin, UA: NEGATIVE
Glucose, UA: NEGATIVE
Ketones, UA: NEGATIVE
Leukocytes, UA: NEGATIVE
NITRITE UA: NEGATIVE
PH UA: 6 (ref 5.0–8.0)
PROTEIN UA: NEGATIVE
RBC UA: NEGATIVE
Spec Grav, UA: 1.02 (ref 1.010–1.025)
UROBILINOGEN UA: 0.2 U/dL

## 2018-01-09 NOTE — Progress Notes (Signed)
[name], Male    DOB: 1975-06-02, 42 y.o.   MRN: [id number] Visit Date: 01/09/2018  Today's Provider: Wilhemena Durie, MD   Chief Complaint  Patient presents with  . Annual Exam   Subjective:    Annual physical exam David Aguirre is a 42 y.o. male who presents today for health maintenance and complete physical. He feels well. He reports exercising regularly. He reports he is sleeping well. Overall he feels pretty well.  Snores a little bit.  No witnessed apnea.  He feels well rested from his sleep.  -----------------------------------------------------------------   Review of Systems  Constitutional: Negative.   HENT: Positive for rhinorrhea, sinus pressure and tinnitus.   Eyes: Negative.   Respiratory: Negative.   Cardiovascular: Negative.   Gastrointestinal: Negative.   Endocrine: Negative.   Genitourinary: Negative.   Musculoskeletal: Positive for arthralgias and joint swelling.  Skin: Negative.   Allergic/Immunologic: Negative.   Neurological: Negative.   Hematological: Negative.   Psychiatric/Behavioral: Negative.     Social History      He  reports that he quit smoking about 19 years ago. His smokeless tobacco use includes snuff. He reports that he drinks alcohol. He reports that he does not use drugs.       Social History   Socioeconomic History  . Marital status: Married    Spouse name: Not on file  . Number of children: Not on file  . Years of education: Not on file  . Highest education level: Not on file  Occupational History  . Not on file  Social Needs  . Financial resource strain: Not on file  . Food insecurity:    Worry: Not on file    Inability: Not on file  . Transportation needs:    Medical: Not on file    Non-medical: Not on file  Tobacco Use  . Smoking status: Former Smoker    Last attempt to quit: 02/26/1998    Years since quitting: 19.8  . Smokeless tobacco: Current User    Types: Snuff  Substance  and Sexual Activity  . Alcohol use: Yes    Comment: 2-3 times a week  . Drug use: No  . Sexual activity: Not on file  Lifestyle  . Physical activity:    Days per week: Not on file    Minutes per session: Not on file  . Stress: Not on file  Relationships  . Social connections:    Talks on phone: Not on file    Gets together: Not on file    Attends religious service: Not on file    Active member of club or organization: Not on file    Attends meetings of clubs or organizations: Not on file    Relationship status: Not on file  Other Topics Concern  . Not on file  Social History Narrative  . Not on file    Past Medical History:  Diagnosis Date  . Low testosterone      Patient Active Problem List   Diagnosis Date Noted  . 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 Clinical depression 09/15/2014  . Acid reflux 09/15/2014  . 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 History of tobacco use 09/15/2014  . Adiposity 09/15/2014    Past Surgical History:  Procedure Laterality Date  . CHOLECYSTECTOMY N/A 11/28/2014   Procedure: LAPAROSCOPIC CHOLECYSTECTOMY WITH INTRAOPERATIVE CHOLANGIOGRAM;  Surgeon: Florene Glen, MD;  Location: [facility] ORS;  Service: General;  Laterality: N/A;  . NASAL SEPTUM  SURGERY    . NASAL SINUS SURGERY    . TONSILLECTOMY    . TURBINATE REDUCTION Bilateral     Family History        Family Status  Relation Name Status  . Mother  Alive  . Father  Alive  . Sister  Alive  . Brother  Alive  . PGF  (Not Specified)        His family history includes Diabetes in his mother; Hyperlipidemia in his father and mother; Prostate cancer in his paternal grandfather.      No Known Allergies   Current Outpatient Medications:  .  ANDROGEL PUMP 20.25 MG/ACT (1.62%) GEL, USE 4 PUMPS ONCE DAILY AS DIRECTED, Disp: 150 g, Rfl: 5 .  MULTIPLE VITAMIN PO, Take by mouth., Disp: , Rfl:    Patient Care Team: Jerrol Banana., MD as PCP - General (Family Medicine)      Objective:    Vitals: BP 126/88 (BP Location: Right Arm, Patient Position: Sitting, Cuff Size: Normal)   Pulse 64   Temp 98.1 F (36.7 C) (Oral)   Resp 16   Ht 6' (1.829 m)   Wt 265 lb (120.2 kg)   BMI 35.94 kg/m    Vitals:   01/09/18 0835  BP: 126/88  Pulse: 64  Resp: 16  Temp: 98.1 F (36.7 C)  TempSrc: Oral  Weight: 265 lb (120.2 kg)  Height: 6' (1.829 m)     Physical Exam  Constitutional: He is oriented to person, place, and time. He appears well-developed and well-nourished.  HENT:  Head: Normocephalic and atraumatic.  Right Ear: External ear normal.  Left Ear: External ear normal.  Nose: Nose normal.  Mouth/Throat: Oropharynx is clear and moist.  Eyes: Pupils are equal, round, and reactive to light. Conjunctivae are normal. No scleral icterus.  Neck: No thyromegaly present.  Cardiovascular: Normal rate, regular rhythm and normal heart sounds.  Pulmonary/Chest: Effort normal and breath sounds normal.  Abdominal: Soft.  Genitourinary: Rectum normal, prostate normal and penis normal.  Musculoskeletal: He exhibits no edema.  Neurological: He is alert and oriented to person, place, and time.  Skin: Skin is warm and dry.  Psychiatric: He has a normal mood and affect. His behavior is normal. Judgment and thought content normal.     Depression Screen PHQ 2/9 Scores 01/09/2018 01/09/2017 09/26/2016  PHQ - 2 Score 1 0 0  PHQ- 9 Score 5 3 -      Assessment & Plan:     Routine Health Maintenance and Physical Exam  Exercise Activities and Dietary recommendations Goals   None     Immunization History  Administered Date(s) Administered  . Influenza Inj Mdck Quad With Preservative 12/11/2017    Health Maintenance  Topic Date Due  . HIV Screening  08/11/1991  . TETANUS/TDAP  08/11/1995  . INFLUENZA VACCINE  Completed     Discussed health benefits of physical activity, and encouraged him to engage in regular exercise appropriate for his age and condition.   1.  Annual physical exam  - CBC with Differential/Platelet - Comprehensive metabolic panel - Lipid Panel With LDL/HDL Ratio - TSH - POCT urinalysis dipstick  2. Prostate cancer screening  - PSA  3. Low testosterone Patient back on AndroGel.  Feels okay.  Obtain testosterone level now. - Testosterone Return to clinic 6 months. --------------------------------------------------------------------   I have done the exam and reviewed the above chart and it is accurate to the best of my knowledge. Diplomatic Services operational officer  technology has been used in this note in any air is in the dictation or transcription are unintentional.  Wilhemena Durie, MD  [place] [facility]

## 2018-01-10 LAB — COMPREHENSIVE METABOLIC PANEL
ALK PHOS: 42 IU/L (ref 39–117)
ALT: 18 IU/L (ref 0–44)
AST: 20 IU/L (ref 0–40)
Albumin/Globulin Ratio: 2.7 — ABNORMAL HIGH (ref 1.2–2.2)
Albumin: 4.8 g/dL (ref 3.5–5.5)
BILIRUBIN TOTAL: 0.9 mg/dL (ref 0.0–1.2)
BUN/Creatinine Ratio: 12 (ref 9–20)
BUN: 14 mg/dL (ref 6–24)
CHLORIDE: 104 mmol/L (ref 96–106)
CO2: 22 mmol/L (ref 20–29)
Calcium: 9.4 mg/dL (ref 8.7–10.2)
Creatinine, Ser: 1.21 mg/dL (ref 0.76–1.27)
GFR calc Af Amer: 85 mL/min/{1.73_m2} (ref >59)
GFR calc non Af Amer: 74 mL/min/{1.73_m2} (ref >59)
GLUCOSE: 92 mg/dL (ref 65–99)
Globulin, Total: 1.8 g/dL (ref 1.5–4.5)
Potassium: 4.7 mmol/L (ref 3.5–5.2)
Sodium: 142 mmol/L (ref 134–144)
Total Protein: 6.6 g/dL (ref 6.0–8.5)

## 2018-01-10 LAB — CBC WITH DIFFERENTIAL/PLATELET
BASOS ABS: 0.1 10*3/uL (ref 0.0–0.2)
Basos: 1 %
EOS (ABSOLUTE): 0.1 10*3/uL (ref 0.0–0.4)
Eos: 2 %
Hematocrit: 47.1 % (ref 37.5–51.0)
Hemoglobin: 16.2 g/dL (ref 13.0–17.7)
Immature Grans (Abs): 0 10*3/uL (ref 0.0–0.1)
Immature Granulocytes: 0 %
LYMPHS: 35 %
Lymphocytes Absolute: 1.9 10*3/uL (ref 0.7–3.1)
MCH: 28.7 pg (ref 26.6–33.0)
MCHC: 34.4 g/dL (ref 31.5–35.7)
MCV: 83 fL (ref 79–97)
Monocytes Absolute: 0.5 10*3/uL (ref 0.1–0.9)
Monocytes: 10 %
NEUTROS ABS: 2.8 10*3/uL (ref 1.4–7.0)
Neutrophils: 52 %
PLATELETS: 241 10*3/uL (ref 150–450)
RBC: 5.65 x10E6/uL (ref 4.14–5.80)
RDW: 12.5 % (ref 12.3–15.4)
WBC: 5.3 10*3/uL (ref 3.4–10.8)

## 2018-01-10 LAB — PSA: PROSTATE SPECIFIC AG, SERUM: 1 ng/mL (ref 0.0–4.0)

## 2018-01-10 LAB — LIPID PANEL WITH LDL/HDL RATIO
CHOLESTEROL TOTAL: 144 mg/dL (ref 100–199)
HDL: 41 mg/dL (ref >39)
LDL Calculated: 93 mg/dL (ref 0–99)
LDl/HDL Ratio: 2.3 ratio (ref 0.0–3.6)
Triglycerides: 49 mg/dL (ref 0–149)
VLDL CHOLESTEROL CAL: 10 mg/dL (ref 5–40)

## 2018-01-10 LAB — TSH: TSH: 1.2 u[IU]/mL (ref 0.450–4.500)

## 2018-01-10 LAB — TESTOSTERONE: Testosterone: 386 ng/dL (ref 264–916)

## 2018-01-18 ENCOUNTER — Other Ambulatory Visit: Payer: Self-pay | Admitting: Family Medicine

## 2018-01-18 DIAGNOSIS — E291 Testicular hypofunction: Secondary | ICD-10-CM

## 2018-03-03 ENCOUNTER — Other Ambulatory Visit: Payer: Self-pay

## 2018-03-03 DIAGNOSIS — E291 Testicular hypofunction: Secondary | ICD-10-CM

## 2018-03-03 NOTE — Telephone Encounter (Signed)
Can Androgel be filled as a generic. Due to insurance.

## 2018-03-07 NOTE — Telephone Encounter (Signed)
Yes ,but I think it might have to be compaounded at [facility] for that.

## 2018-03-10 ENCOUNTER — Telehealth: Payer: Self-pay | Admitting: Family Medicine

## 2018-03-10 MED ORDER — TESTOSTERONE 20.25 MG/ACT (1.62%) TD GEL: TRANSDERMAL | 5 refills | Status: DC

## 2018-03-10 NOTE — Telephone Encounter (Signed)
Yes --if they have it

## 2018-03-10 NOTE — Telephone Encounter (Signed)
[facility] wants to know if they can give David Aguirre generic Androgel instead of name brand.

## 2018-03-10 NOTE — Telephone Encounter (Signed)
You have the Rx Request in your Rx box

## 2018-03-10 NOTE — Telephone Encounter (Signed)
Sent!

## 2018-07-07 ENCOUNTER — Other Ambulatory Visit: Payer: Self-pay

## 2018-07-07 ENCOUNTER — Encounter: Payer: Self-pay | Admitting: Family Medicine

## 2018-07-07 ENCOUNTER — Ambulatory Visit: Payer: PRIVATE HEALTH INSURANCE | Admitting: Family Medicine

## 2018-07-07 VITALS — BP 110/76 | HR 68 | Temp 97.7°F | Resp 16 | Wt 263.0 lb

## 2018-07-07 DIAGNOSIS — E291 Testicular hypofunction: Secondary | ICD-10-CM | POA: Diagnosis not present

## 2018-07-07 DIAGNOSIS — K219 Gastro-esophageal reflux disease without esophagitis: Secondary | ICD-10-CM

## 2018-07-07 NOTE — Progress Notes (Signed)
[name] Male    DOB: 03/24/1975   43 y.o.   MRN: [id number] Visit Date: 07/07/2018  Today's Provider: Wilhemena Durie, MD   Chief Complaint  Patient presents with  . Follow-up   Subjective:     HPI   Pt is coming in today for a six month follow up Hypogonadism.  Pt states he is feeling well and is tolerating Testosterone 25.25mg /ACT well with no known side effects.   Overall he is feeling well during covid pandemic. No Known Allergies   Current Outpatient Medications:  .  finasteride (PROPECIA) 1 MG tablet, Take 1 mg by mouth daily., Disp: , Rfl:  .  MULTIPLE VITAMIN PO, Take by mouth., Disp: , Rfl:  .  Testosterone 20.25 MG/ACT (1.62%) GEL, 4 pumps topically daily, Disp: 150 g, Rfl: 5 .  ANDROGEL PUMP 20.25 MG/ACT (1.62%) GEL, USE 4 PUMPS ONCE DAILY AS DIRECTED (Patient not taking: Reported on 07/07/2018), Disp: 150 g, Rfl: 5  Review of Systems  Constitutional: Negative.   HENT: Negative.   Eyes: Negative.   Respiratory: Negative.   Cardiovascular: Negative.   Gastrointestinal: Negative.   Endocrine: Negative.   Allergic/Immunologic: Negative.   Neurological: Negative for dizziness, light-headedness and headaches.  Hematological: Negative.   Psychiatric/Behavioral: Negative.     Social History   Tobacco Use  . Smoking status: Former Smoker    Last attempt to quit: 02/26/1998    Years since quitting: 20.3  . Smokeless tobacco: Current User    Types: Snuff  Substance Use Topics  . Alcohol use: Yes    Comment: 2-3 times a week      Objective:   BP 110/76 (BP Location: Right Arm, Patient Position: Sitting, Cuff Size: Large)   Pulse 68   Temp 97.7 F (36.5 C) (Oral)   Resp 16   Wt 263 lb (119.3 kg)   BMI 35.67 kg/m  Vitals:   07/07/18 0815  BP: 110/76  Pulse: 68  Resp: 16  Temp: 97.7 F (36.5 C)  TempSrc: Oral  Weight: 263 lb (119.3 kg)     Physical Exam Vitals signs reviewed.  Constitutional:      Appearance: He is  well-developed.  HENT:     Head: Normocephalic and atraumatic.  Eyes:     General: No scleral icterus.    Conjunctiva/sclera: Conjunctivae normal.  Neck:     Thyroid: No thyromegaly.  Cardiovascular:     Rate and Rhythm: Normal rate and regular rhythm.     Heart sounds: Normal heart sounds.  Pulmonary:     Effort: Pulmonary effort is normal.     Breath sounds: Normal breath sounds.  Abdominal:     Palpations: Abdomen is soft.  Skin:    General: Skin is warm and dry.  Neurological:     Mental Status: He is alert and oriented to person, place, and time. Mental status is at baseline.  Psychiatric:        Mood and Affect: Mood normal.        Behavior: Behavior normal.        Thought Content: Thought content normal.        Judgment: Judgment normal.         Assessment & Plan    1. Hypogonadism male RTC 6 months for level and CPE.  2. Gastroesophageal reflux disease without esophagitis      Wilhemena Durie, MD  [facility]

## 2018-09-29 ENCOUNTER — Telehealth: Payer: Self-pay | Admitting: Family Medicine

## 2018-09-29 NOTE — Telephone Encounter (Signed)
[facility] faxed refill request for the following medications:  Testosterone 20.25 MG/ACT (1.62%) GEL   Please advise.

## 2018-09-30 MED ORDER — TESTOSTERONE 20.25 MG/ACT (1.62%) TD GEL: TRANSDERMAL | 5 refills | Status: DC

## 2018-09-30 NOTE — Telephone Encounter (Signed)
Please review. Thanks!  

## 2018-10-02 ENCOUNTER — Telehealth: Payer: Self-pay | Admitting: Family Medicine

## 2018-10-02 NOTE — Telephone Encounter (Signed)
Pt stated [facility] in [place] advised they sent a PA for Testosterone 20.25 MG/ACT (1.62%) GEL to the office. Pt is requesting call back for a status update on the PA. Please advise. Thanks TNP

## 2018-10-02 NOTE — Telephone Encounter (Signed)
We received another prescription request for this medication from [facility] in [place].  It looks like it may have been sent to [facility].  Please advise.

## 2018-10-07 ENCOUNTER — Other Ambulatory Visit: Payer: Self-pay

## 2018-10-07 MED ORDER — TESTOSTERONE 20.25 MG/ACT (1.62%) TD GEL: TRANSDERMAL | 5 refills | Status: DC

## 2018-10-07 NOTE — Telephone Encounter (Signed)
PA approved. Patient and pharmacy notified.

## 2018-10-07 NOTE — Telephone Encounter (Signed)
Can you please re-send into the pharmacy? We had to do a prior auth on this medication and the pharmacy needs [place] to resend. Thanks!

## 2018-10-07 NOTE — Telephone Encounter (Signed)
Pt called for an update on the status of his PA. Pt was advised that PA was sent to insurance and we are waiting on a response. Thanks TNP

## 2019-01-14 ENCOUNTER — Encounter: Payer: PRIVATE HEALTH INSURANCE | Admitting: Family Medicine

## 2019-02-10 ENCOUNTER — Encounter: Payer: Self-pay | Admitting: Family Medicine

## 2019-03-20 NOTE — Progress Notes (Signed)
[name], Male    DOB: 10-Feb-1976, 44 y.o.   MRN: [id number] Visit Date: 03/24/2019  Today's Provider: Wilhemena Durie, MD   Chief Complaint  Patient presents with  . Annual Exam   Subjective:     Annual physical exam David Aguirre is a 44 y.o. male who presents today for health maintenance and complete physical. He feels well. He reports exercising yes. He reports he is sleeping well.  ----------------------------------------------------------   Review of Systems  Constitutional: Negative.   HENT: Positive for rhinorrhea, sinus pressure and tinnitus.        Itching at opening of EACs.  Respiratory: Negative.   Cardiovascular: Negative.   Gastrointestinal: Positive for blood in stool.       Recent rectal itching with occasional blood in stool  Endocrine: Negative.   Musculoskeletal: Positive for arthralgias, joint swelling and neck stiffness.  Allergic/Immunologic: Positive for environmental allergies.  Neurological: Negative.   Hematological: Negative.   Psychiatric/Behavioral: Negative.     Social History      He  reports that he quit smoking about 21 years ago. His smokeless tobacco use includes snuff. He reports current alcohol use. He reports that he does not use drugs.       Social History   Socioeconomic History  . Marital status: Married    Spouse name: Not on file  . Number of children: Not on file  . Years of education: Not on file  . Highest education level: Not on file  Occupational History  . Not on file  Tobacco Use  . Smoking status: Former Smoker    Quit date: 02/26/1998    Years since quitting: 21.0  . Smokeless tobacco: Current User    Types: Snuff  Substance and Sexual Activity  . Alcohol use: Yes    Comment: 2-3 times a week  . Drug use: No  . Sexual activity: Not on file  Other Topics Concern  . Not on file  Social History Narrative  . Not on file   Social Determinants of Health   Financial Resource  Strain:   . Difficulty of Paying Living Expenses: Not on file  Food Insecurity:   . Worried About Charity fundraiser in the Last Year: Not on file  . Ran Out of Food in the Last Year: Not on file  Transportation Needs:   . Lack of Transportation (Medical): Not on file  . Lack of Transportation (Non-Medical): Not on file  Physical Activity:   . Days of Exercise per Week: Not on file  . Minutes of Exercise per Session: Not on file  Stress:   . Feeling of Stress : Not on file  Social Connections:   . Frequency of Communication with Friends and Family: Not on file  . Frequency of Social Gatherings with Friends and Family: Not on file  . Attends Religious Services: Not on file  . Active Member of Clubs or Organizations: Not on file  . Attends Archivist Meetings: Not on file  . Marital Status: Not on file    Past Medical History:  Diagnosis Date  . Low testosterone      Patient Active Problem List   Diagnosis Date Noted  . 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 Clinical depression 09/15/2014  . Acid reflux 09/15/2014  . 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 History of tobacco use 09/15/2014  . Adiposity 09/15/2014    Past Surgical History:  Procedure Laterality Date  .  CHOLECYSTECTOMY N/A 11/28/2014   Procedure: LAPAROSCOPIC CHOLECYSTECTOMY WITH INTRAOPERATIVE CHOLANGIOGRAM;  Surgeon: Florene Glen, MD;  Location: [facility] ORS;  Service: General;  Laterality: N/A;  . NASAL SEPTUM SURGERY    . NASAL SINUS SURGERY    . TONSILLECTOMY    . TURBINATE REDUCTION Bilateral     Family History        Family Status  Relation Name Status  . Mother  Alive  . Father  Alive  . Sister  Alive  . Brother  Alive  . PGF  (Not Specified)        His family history includes Diabetes in his mother; Hyperlipidemia in his father and mother; Prostate cancer in his paternal grandfather.      No Known Allergies   Current Outpatient Medications:  .  FIBER PO, Take by mouth., Disp: , Rfl:  .   finasteride (PROPECIA) 1 MG tablet, Take 1 mg by mouth daily., Disp: , Rfl:  .  MULTIPLE VITAMIN PO, Take by mouth., Disp: , Rfl:  .  Testosterone 20.25 MG/ACT (1.62%) GEL, 4 pumps topically daily, Disp: 150 g, Rfl: 5 .  ANDROGEL PUMP 20.25 MG/ACT (1.62%) GEL, USE 4 PUMPS ONCE DAILY AS DIRECTED (Patient not taking: Reported on 07/07/2018), Disp: 150 g, Rfl: 5   Patient Care Team: Jerrol Banana., MD as PCP - General (Family Medicine)    Objective:    Vitals: BP (!) 143/92 (BP Location: Right Arm, Patient Position: Sitting, Cuff Size: Large)   Pulse 86   Temp (!) 96.9 F (36.1 C) (Other (Comment))   Resp 18   Ht 6' (1.829 m)   Wt 272 lb (123.4 kg)   SpO2 96%   BMI 36.89 kg/m    Vitals:   03/24/19 1141  BP: (!) 143/92  Pulse: 86  Resp: 18  Temp: (!) 96.9 F (36.1 C)  TempSrc: Other (Comment)  SpO2: 96%  Weight: 272 lb (123.4 kg)  Height: 6' (1.829 m)     Physical Exam Vitals reviewed.  Constitutional:      Appearance: He is well-developed.  HENT:     Head: Normocephalic and atraumatic.     Right Ear: External ear normal.     Left Ear: External ear normal.     Nose: Nose normal.  Eyes:     General: No scleral icterus.    Conjunctiva/sclera: Conjunctivae normal.     Pupils: Pupils are equal, round, and reactive to light.  Neck:     Thyroid: No thyromegaly.  Cardiovascular:     Rate and Rhythm: Normal rate and regular rhythm.     Heart sounds: Normal heart sounds.  Pulmonary:     Effort: Pulmonary effort is normal.     Breath sounds: Normal breath sounds.  Abdominal:     Palpations: Abdomen is soft.  Genitourinary:    Penis: Normal.      Prostate: Normal.     Rectum: Normal. Guaiac result positive.     Comments: Perianal exam normal Skin:    General: Skin is warm and dry.     Comments: Atypical nevus on back.  Neurological:     General: No focal deficit present.     Mental Status: He is alert and oriented to person, place, and time.  Psychiatric:         Mood and Affect: Mood normal.        Behavior: Behavior normal.        Thought Content: Thought content normal.  Judgment: Judgment normal.      Depression Screen PHQ 2/9 Scores 03/24/2019 01/09/2018 01/09/2017 09/26/2016  PHQ - 2 Score 0 1 0 0  PHQ- 9 Score 0 5 3 -       Assessment & Plan:     Routine Health Maintenance and Physical Exam  Exercise Activities and Dietary recommendations Goals   None     Immunization History  Administered Date(s) Administered  . Influenza Inj Mdck Quad With Preservative 12/11/2017    Health Maintenance  Topic Date Due  . HIV Screening  08/11/1991  . TETANUS/TDAP  08/11/1995  . INFLUENZA VACCINE  09/27/2018     Discussed health benefits of physical activity, and encouraged him to engage in regular exercise appropriate for his age and condition.    --------------------------------------------------------------------  1. Annual physical exam  - TSH - CBC w/Diff/Platelet - Comprehensive Metabolic Panel (CMET) - Lipid panel - PSA - POCT urinalysis dipstick--normal - Prolactin - Testosterone - triamcinolone cream (KENALOG) 0.1 %; Daily at bedtime for 2 weeks  Dispense: 15 g; Refill: 1  2. Prostate cancer screening  - PSA  3. Low testosterone  - Prolactin - Testosterone  4. Hypogonadism male Doing well on AndroGel. - Prolactin - Testosterone  5. Internal hemorrhoid May need GI referral in this 44 year old if bleeding continues. - POCT Occult Blood Stool - hydrocortisone (ANUSOL-HC) 25 MG suppository; Place 1 suppository (25 mg total) rectally at bedtime.  Dispense: 12 suppository; Refill: 4  6. Eczema of external ear, unspecified laterality  - triamcinolone cream (KENALOG) 0.1 %; Daily at bedtime for 2 weeks  Dispense: 15 g; Refill: 1   Follow up in 6 months.    I, ,acting as a scribe for Wilhemena Durie, MD.,have documented all relevant documentation on the behalf of Wilhemena Durie, MD,as directed by  Wilhemena Durie, MD while in the presence of Wilhemena Durie, MD.   Wilhemena Durie, MD  [facility]

## 2019-03-24 ENCOUNTER — Other Ambulatory Visit: Payer: Self-pay

## 2019-03-24 ENCOUNTER — Encounter: Payer: Self-pay | Admitting: Family Medicine

## 2019-03-24 ENCOUNTER — Ambulatory Visit (INDEPENDENT_AMBULATORY_CARE_PROVIDER_SITE_OTHER): Payer: PRIVATE HEALTH INSURANCE | Admitting: Family Medicine

## 2019-03-24 VITALS — BP 143/92 | HR 86 | Temp 96.9°F | Resp 18 | Ht 72.0 in | Wt 272.0 lb

## 2019-03-24 DIAGNOSIS — E291 Testicular hypofunction: Secondary | ICD-10-CM | POA: Diagnosis not present

## 2019-03-24 DIAGNOSIS — H60549 Acute eczematoid otitis externa, unspecified ear: Secondary | ICD-10-CM | POA: Diagnosis not present

## 2019-03-24 DIAGNOSIS — Z125 Encounter for screening for malignant neoplasm of prostate: Secondary | ICD-10-CM | POA: Diagnosis not present

## 2019-03-24 DIAGNOSIS — K648 Other hemorrhoids: Secondary | ICD-10-CM | POA: Diagnosis not present

## 2019-03-24 DIAGNOSIS — Z0000 Encounter for general adult medical examination without abnormal findings: Secondary | ICD-10-CM

## 2019-03-24 DIAGNOSIS — R7989 Other specified abnormal findings of blood chemistry: Secondary | ICD-10-CM | POA: Diagnosis not present

## 2019-03-24 LAB — POCT URINALYSIS DIPSTICK
Appearance: NORMAL
Bilirubin, UA: NEGATIVE
Blood, UA: NEGATIVE
Glucose, UA: NEGATIVE
Ketones, UA: NEGATIVE
Leukocytes, UA: NEGATIVE
Nitrite, UA: NEGATIVE
Odor: NORMAL
Protein, UA: NEGATIVE
Spec Grav, UA: 1.02 (ref 1.010–1.025)
Urobilinogen, UA: 0.2 E.U./dL
pH, UA: 7 (ref 5.0–8.0)

## 2019-03-24 LAB — HEMOCCULT GUIAC POC 1CARD (OFFICE): Fecal Occult Blood, POC: POSITIVE — AB

## 2019-03-24 MED ORDER — TRIAMCINOLONE ACETONIDE 0.1 % EX CREA: TOPICAL_CREAM | CUTANEOUS | 1 refills | Status: AC

## 2019-03-24 MED ORDER — HYDROCORTISONE ACETATE 25 MG RE SUPP: 25.0000 mg | Freq: Two times a day (BID) | RECTAL | 0 refills | Status: DC

## 2019-03-24 MED ORDER — HYDROCORTISONE ACETATE 25 MG RE SUPP: 25.0000 mg | Freq: Every day | RECTAL | 4 refills | Status: DC

## 2019-04-01 LAB — CBC WITH DIFFERENTIAL/PLATELET
Basophils Absolute: 0 10*3/uL (ref 0.0–0.2)
Basos: 1 %
EOS (ABSOLUTE): 0.2 10*3/uL (ref 0.0–0.4)
Eos: 2 %
Hematocrit: 48.1 % (ref 37.5–51.0)
Hemoglobin: 16.6 g/dL (ref 13.0–17.7)
Immature Grans (Abs): 0 10*3/uL (ref 0.0–0.1)
Immature Granulocytes: 1 %
Lymphocytes Absolute: 2.4 10*3/uL (ref 0.7–3.1)
Lymphs: 37 %
MCH: 29.4 pg (ref 26.6–33.0)
MCHC: 34.5 g/dL (ref 31.5–35.7)
MCV: 85 fL (ref 79–97)
Monocytes Absolute: 0.6 10*3/uL (ref 0.1–0.9)
Monocytes: 10 %
Neutrophils Absolute: 3.1 10*3/uL (ref 1.4–7.0)
Neutrophils: 49 %
Platelets: 250 10*3/uL (ref 150–450)
RBC: 5.65 x10E6/uL (ref 4.14–5.80)
RDW: 12.9 % (ref 11.6–15.4)
WBC: 6.3 10*3/uL (ref 3.4–10.8)

## 2019-04-01 LAB — COMPREHENSIVE METABOLIC PANEL
ALT: 20 IU/L (ref 0–44)
AST: 22 IU/L (ref 0–40)
Albumin/Globulin Ratio: 2.3 — ABNORMAL HIGH (ref 1.2–2.2)
Albumin: 4.6 g/dL (ref 4.0–5.0)
Alkaline Phosphatase: 45 IU/L (ref 39–117)
BUN/Creatinine Ratio: 15 (ref 9–20)
BUN: 19 mg/dL (ref 6–24)
Bilirubin Total: 0.8 mg/dL (ref 0.0–1.2)
CO2: 23 mmol/L (ref 20–29)
Calcium: 9.3 mg/dL (ref 8.7–10.2)
Chloride: 101 mmol/L (ref 96–106)
Creatinine, Ser: 1.27 mg/dL (ref 0.76–1.27)
GFR calc Af Amer: 80 mL/min/{1.73_m2} (ref >59)
GFR calc non Af Amer: 69 mL/min/{1.73_m2} (ref >59)
Globulin, Total: 2 g/dL (ref 1.5–4.5)
Glucose: 89 mg/dL (ref 65–99)
Potassium: 4.7 mmol/L (ref 3.5–5.2)
Sodium: 138 mmol/L (ref 134–144)
Total Protein: 6.6 g/dL (ref 6.0–8.5)

## 2019-04-01 LAB — LIPID PANEL
Chol/HDL Ratio: 3.8 ratio (ref 0.0–5.0)
Cholesterol, Total: 161 mg/dL (ref 100–199)
HDL: 42 mg/dL (ref >39)
LDL Chol Calc (NIH): 106 mg/dL — ABNORMAL HIGH (ref 0–99)
Triglycerides: 65 mg/dL (ref 0–149)
VLDL Cholesterol Cal: 13 mg/dL (ref 5–40)

## 2019-04-01 LAB — TESTOSTERONE: Testosterone: 428 ng/dL (ref 264–916)

## 2019-04-01 LAB — PSA: Prostate Specific Ag, Serum: 1.2 ng/mL (ref 0.0–4.0)

## 2019-04-01 LAB — TSH: TSH: 1.49 u[IU]/mL (ref 0.450–4.500)

## 2019-04-01 LAB — PROLACTIN: Prolactin: 8.6 ng/mL (ref 4.0–15.2)

## 2019-04-03 ENCOUNTER — Telehealth: Payer: Self-pay

## 2019-04-03 NOTE — Telephone Encounter (Signed)
Unable to leave message due to VM being full.  °

## 2019-04-03 NOTE — Telephone Encounter (Signed)
-----   Message from Jerrol Banana., MD sent at 04/03/2019  8:16 AM EST ----- Labs all stable.

## 2019-04-08 NOTE — Telephone Encounter (Signed)
Advised 

## 2019-05-08 ENCOUNTER — Other Ambulatory Visit: Payer: Self-pay | Admitting: Family Medicine

## 2019-05-12 ENCOUNTER — Other Ambulatory Visit: Payer: Self-pay | Admitting: Family Medicine

## 2019-05-12 NOTE — Telephone Encounter (Signed)
Please advise 

## 2019-05-12 NOTE — Telephone Encounter (Signed)
Medication Refill - Medication: Testosterone 1.62 % GEL   Has the patient contacted their pharmacy? Yes.   (Agent: If no, request that the patient contact the pharmacy for the refill.) (Agent: If yes, when and what did the pharmacy advise?)  Preferred Pharmacy (with phone number or street name):  [facility] [place], [place] - [address]  [place] [address]  Phone: [phone] Fax: [phone]     Agent: Please be advised that RX refills may take up to 3 business days. We ask that you follow-up with your pharmacy.

## 2019-05-12 NOTE — Telephone Encounter (Signed)
Requested medication (s) are due for refill today: yes  Requested medication (s) are on the active medication list: yes  Last refill:  05/08/19  Future visit scheduled: yes  Notes to clinic: no assigned protocol   Requested Prescriptions  Pending Prescriptions Disp Refills   Testosterone 1.62 % GEL 150 g 1      Off-Protocol Failed - 05/12/2019 10:45 AM      Failed - Medication not assigned to a protocol, review manually.      Passed - Valid encounter within last 12 months    Recent Outpatient Visits           1 month ago Annual physical exam   [facility] Jerrol Banana., MD   10 months ago Hypogonadism male   [facility] Jerrol Banana., MD   1 year ago Annual physical exam   [facility] Jerrol Banana., MD   1 year ago Hypogonadism male   [facility] Jerrol Banana., MD   2 years ago Annual physical exam   [facility] Jerrol Banana., MD       Future Appointments             In 4 months Jerrol Banana., MD [facility], [place]

## 2019-05-18 MED ORDER — TESTOSTERONE 1.62 % TD GEL: 4.0000 | Freq: Every day | TRANSDERMAL | 1 refills | Status: DC

## 2019-08-12 ENCOUNTER — Other Ambulatory Visit: Payer: Self-pay | Admitting: Adult Health

## 2019-08-12 ENCOUNTER — Telehealth: Payer: Self-pay | Admitting: Adult Health

## 2019-08-12 ENCOUNTER — Ambulatory Visit
Admission: EM | Admit: 2019-08-12 | Discharge: 2019-08-12 | Disposition: A | Discharge status: Home / Self Care | Payer: PRIVATE HEALTH INSURANCE | Attending: Internal Medicine | Admitting: Internal Medicine

## 2019-08-12 ENCOUNTER — Other Ambulatory Visit: Payer: Self-pay

## 2019-08-12 DIAGNOSIS — U071 COVID-19: Secondary | ICD-10-CM

## 2019-08-12 MED ORDER — ALBUTEROL SULFATE HFA 108 (90 BASE) MCG/ACT IN AERS: 2.0000 | INHALATION_SPRAY | RESPIRATORY_TRACT | 0 refills | Status: DC | PRN

## 2019-08-12 MED ORDER — HYDROXYCHLOROQUINE SULFATE 200 MG PO TABS: ORAL_TABLET | ORAL | 0 refills | Status: DC

## 2019-08-12 NOTE — ED Provider Notes (Signed)
[facility]    CSN: [id number] Arrival date & time: 08/12/19  1622      History   Chief Complaint Chief Complaint  Patient presents with  . Covid Exposure    HPI David Aguirre is a 44 y.o. male. who was + for covid 6/12 and had televisit and the MD told him he needed [place] to send the order for the infusion treatment. Pt also wants to have steroid.  Has had fevers off and on but has not measured it, feels he had a fever this am.  Has had joint pains, and non productive cough, fatigue since 6/12, then yesterday started loosign his taste and smell, and its gone today. Has had mild wheezing. Has had mild diarrhea, poor appetite, but no nausea or vomiting.  Has been taking cough medication, mucinex, vit C, and  Zinc. Has not been taking vitamin D or melatonin. Has been in [place]. Last week and think maybe picked it up then. His adult son also seems to have it.     Past Medical History:  Diagnosis Date  . Low testosterone     Patient Active Problem List   Diagnosis Date Noted  . 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 Clinical depression 09/15/2014  . Acid reflux 09/15/2014  . 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 History of tobacco use 09/15/2014  . Adiposity 09/15/2014    Past Surgical History:  Procedure Laterality Date  . CHOLECYSTECTOMY N/A 11/28/2014   Procedure: LAPAROSCOPIC CHOLECYSTECTOMY WITH INTRAOPERATIVE CHOLANGIOGRAM;  Surgeon: Florene Glen, MD;  Location: [facility] ORS;  Service: General;  Laterality: N/A;  . NASAL SEPTUM SURGERY    . NASAL SINUS SURGERY    . TONSILLECTOMY    . TURBINATE REDUCTION Bilateral        Home Medications    Prior to Admission medications   Medication Sig Start Date End Date Taking? Authorizing Provider  albuterol (VENTOLIN HFA) 108 (90 Base) MCG/ACT inhaler Inhale 2 puffs into the lungs every 4 (four) hours as needed for wheezing or shortness of breath. 08/12/19   Rodriguez-Southworth, Sunday Spillers, PA-C  FIBER PO Take by mouth.    [provider]  finasteride (PROPECIA) 1 MG tablet Take 1 mg by mouth daily.    [provider]  hydrocortisone (ANUSOL-HC) 25 MG suppository Place 1 suppository (25 mg total) rectally at bedtime. 03/24/19   Jerrol Banana., MD  hydroxychloroquine (PLAQUENIL) 200 MG tablet Take 2 bid on day one, then 2 per week 7days later  for 4 weeks for positive COVID. Take it along with Zinc 50 mg daily 08/12/19   Rodriguez-Southworth, Sunday Spillers, PA-C  MULTIPLE VITAMIN PO Take by mouth.    [provider]  Testosterone 1.62 % GEL Place 4 Squirts onto the skin daily. 05/18/19   Jerrol Banana., MD  triamcinolone cream (KENALOG) 0.1 % Daily at bedtime for 2 weeks 03/24/19   Jerrol Banana., MD    Family History Family History  Problem Relation Age of Onset  . Diabetes Mother   . Hyperlipidemia Mother   . Hyperlipidemia Father   . Prostate cancer Paternal Grandfather     Social History Social History   Tobacco Use  . Smoking status: Former Smoker    Quit date: 02/26/1998    Years since quitting: 21.4  . Smokeless tobacco: Current User    Types: Snuff  Vaping Use  . Vaping Use: Never used  Substance Use Topics  . Alcohol use: Yes  Comment: 2-3 times a week  . Drug use: No     Allergies   Patient has no known allergies.   Review of Systems Review of Systems  Constitutional: Positive for appetite change, chills, fatigue and fever.  HENT: Positive for congestion, postnasal drip, sinus pressure and sinus pain. Negative for ear pain, rhinorrhea, sore throat and trouble swallowing.   Eyes: Negative for discharge.  Respiratory: Positive for cough. Negative for chest tightness and shortness of breath.        Has wheezed a little  Cardiovascular: Negative for chest pain.  Gastrointestinal: Positive for diarrhea. Negative for abdominal pain, constipation, nausea and vomiting.  Musculoskeletal: Positive for myalgias. Negative for gait problem.  Skin: Negative for  rash.  Neurological: Positive for headaches. Negative for dizziness and weakness.  Hematological: Negative for adenopathy.     Physical Exam Triage Vital Signs ED Triage Vitals [08/12/19 1641]  Enc Vitals Group     BP      Pulse      Resp      Temp      Temp src      SpO2      Weight 271 lb 2.7 oz (123 kg)     Height 6' (1.829 m)     Head Circumference      Peak Flow      Pain Score      Pain Loc      Pain Edu?      Excl. in [place]?    No data found.  Updated Vital Signs Ht 6' (1.829 m)   Wt 271 lb 2.7 oz (123 kg)   BMI 36.78 kg/m   Visual Acuity Right Eye Distance:   Left Eye Distance:   Bilateral Distance:    Right Eye Near:   Left Eye Near:    Bilateral Near:     Physical Exam Vitals and nursing note reviewed.  Constitutional:      General: He is not in acute distress.    Appearance: He is not ill-appearing, toxic-appearing or diaphoretic.  HENT:     Right Ear: Tympanic membrane, ear canal and external ear normal.     Left Ear: Tympanic membrane, ear canal and external ear normal.     Nose: Congestion present. No rhinorrhea.     Mouth/Throat:     Mouth: Mucous membranes are moist.     Pharynx: Oropharynx is clear. No oropharyngeal exudate or posterior oropharyngeal erythema.  Eyes:     General: No scleral icterus.    Extraocular Movements: Extraocular movements intact.     Conjunctiva/sclera: Conjunctivae normal.  Cardiovascular:     Rate and Rhythm: Normal rate and regular rhythm.     Heart sounds: Normal heart sounds. No murmur heard.   Pulmonary:     Effort: Pulmonary effort is normal. No respiratory distress.     Breath sounds: Normal breath sounds. No wheezing, rhonchi or rales.  Abdominal:     General: Bowel sounds are normal. There is no distension.     Palpations: Abdomen is soft. There is no mass.     Tenderness: There is no abdominal tenderness. There is no guarding or rebound.  Musculoskeletal:        General: Normal range of motion.      Cervical back: Neck supple. No rigidity.     Right lower leg: No edema.     Left lower leg: No edema.  Lymphadenopathy:     Cervical: No cervical adenopathy.  Skin:  General: Skin is warm and dry.     Coloration: Skin is not jaundiced.     Findings: No rash.  Neurological:     Mental Status: He is alert and oriented to person, place, and time.     Gait: Gait normal.  Psychiatric:        Mood and Affect: Mood normal.        Behavior: Behavior normal.        Thought Content: Thought content normal.        Judgment: Judgment normal.      UC Treatments / Results  Labs (all labs ordered are listed, but only abnormal results are displayed) Labs Reviewed - No data to display  EKG   Radiology No results found.  Procedures Procedures (including critical care time)  Medications Ordered in UC Medications - No data to display  Initial Impression / Assessment and Plan / UC Course  I have reviewed the triage vital signs and the nursing notes.  Final Clinical Impressions(s) / UC Diagnoses   Final diagnoses:  [id number]     Discharge Instructions       We have called the infusion clinic and they will call you to make the appointment and have sent your prescription.  You may also take the following supplements to help your immune system be stronger to fight this viral infection Take Quarcetin 500 mg three times a day x 7 days with Zinc 50 mg ones a day x 7 days. The quarcetin is an antiviral and anti-inflammatory supplement which helps open the zinc channels in the cell to absorb Zinc. Zinc helps decrease the virus load in your body.  Also make sure to take Vit D 5,000 IU per day with a fatty meal and Vit C 1000 mg a day until you are completely better. Stay on Vitamin D 2,000 the rest of the season. I dont see you have had any Vit D levels done in the labs I checked in Epic. Have those checked the next time you see your family Dr.      ED Prescriptions    Medication Sig  [place]. Provider   hydroxychloroquine (PLAQUENIL) 200 MG tablet Take 2 bid on day one, then 2 per week 7days later  for 4 weeks for positive COVID. Take it along with Zinc 50 mg daily 10 tablet Rodriguez-Southworth, Sunday Spillers, PA-C   albuterol (VENTOLIN HFA) 108 (90 Base) MCG/ACT inhaler Inhale 2 puffs into the lungs every 4 (four) hours as needed for wheezing or shortness of breath. 18 g Rodriguez-Southworth, Sunday Spillers, PA-C     PDMP not reviewed this encounter.   Shelby Mattocks, Hershal Coria 08/13/19 2127

## 2019-08-12 NOTE — ED Notes (Addendum)
Message left with infusion center regarding outpatient infusion per PA request.

## 2019-08-12 NOTE — Discharge Instructions (Addendum)
   We have called the infusion clinic and they will call you to make the appointment and have sent your prescription.  You may also take the following supplements to help your immune system be stronger to fight this viral infection Take Quarcetin 500 mg three times a day x 7 days with Zinc 50 mg ones a day x 7 days. The quarcetin is an antiviral and anti-inflammatory supplement which helps open the zinc channels in the cell to absorb Zinc. Zinc helps decrease the virus load in your body.  Also make sure to take Vit D 5,000 IU per day with a fatty meal and Vit C 1000 mg a day until you are completely better. Stay on Vitamin D 2,000 the rest of the season. I dont see you have had any Vit D levels done in the labs I checked in Epic. Have those checked the next time you see your family Dr.

## 2019-08-12 NOTE — Progress Notes (Signed)
  I connected by phone with David Aguirre on 08/12/2019 at 6:44 PM to discuss the potential use of an new treatment for mild to moderate COVID-19 viral infection in non-hospitalized patients.  This patient is a 44 y.o. male that meets the FDA criteria for Emergency Use Authorization of bamlanivimab/etesevimab or casirivimab/imdevimab.  Has a (+) direct SARS-CoV-2 viral test result  Has mild or moderate COVID-19   Is NOT hospitalized due to COVID-19  Is within 10 days of symptom onset  Has at least one of the high risk factor(s) for progression to severe COVID-19 and/or hospitalization as defined in EUA.  Specific high risk criteria : BMI > 25   I have spoken and communicated the following to the patient or parent/caregiver:  1. FDA has authorized the emergency use of bamlanivimab/etesevimab and casirivimab\imdevimab for the treatment of mild to moderate COVID-19 in adults and pediatric patients with positive results of direct SARS-CoV-2 viral testing who are 31 years of age and older weighing at least 40 kg, and who are at high risk for progressing to severe COVID-19 and/or hospitalization.  2. The significant known and potential risks and benefits of bamlanivimab/etesevimab and casirivimab\imdevimab, and the extent to which such potential risks and benefits are unknown.  3. Information on available alternative treatments and the risks and benefits of those alternatives, including clinical trials.  4. Patients treated with bamlanivimab/etesevimab and casirivimab\imdevimab should continue to self-isolate and use infection control measures (e.g., wear mask, isolate, social distance, avoid sharing personal items, clean and disinfect "high touch" surfaces, and frequent handwashing) according to CDC guidelines.   5. The patient or parent/caregiver has the option to accept or refuse bamlanivimab/etesevimab or casirivimab\imdevimab .  After reviewing this information with the patient, The  patient agreed to proceed with receiving the bamlanimivab infusion and will be provided a copy of the Fact sheet prior to receiving the infusion.Scot Dock 08/12/2019 6:44 PM

## 2019-08-12 NOTE — ED Triage Notes (Signed)
Pt COVID + on 6/14, symptoms starting 6/12. States he was sent by televisit for antibody infusion.

## 2019-08-12 NOTE — Telephone Encounter (Signed)
Enrolled in MOAB therapy, my chart message sent to communicate address of infusion center

## 2019-08-13 ENCOUNTER — Ambulatory Visit (HOSPITAL_COMMUNITY)
Admission: RE | Admit: 2019-08-13 | Discharge: 2019-08-13 | Disposition: A | Discharge status: Home / Self Care | Payer: PRIVATE HEALTH INSURANCE | Source: Ambulatory Visit | Attending: Pulmonary Disease | Admitting: Pulmonary Disease

## 2019-08-13 ENCOUNTER — Encounter (HOSPITAL_COMMUNITY): Payer: Self-pay

## 2019-08-13 DIAGNOSIS — U071 COVID-19: Secondary | ICD-10-CM | POA: Insufficient documentation

## 2019-08-13 MED ORDER — DIPHENHYDRAMINE HCL 50 MG/ML IJ SOLN: 50.0000 mg | Freq: Once | INTRAMUSCULAR | Status: DC | PRN

## 2019-08-13 MED ORDER — ALBUTEROL SULFATE HFA 108 (90 BASE) MCG/ACT IN AERS: 2.0000 | INHALATION_SPRAY | Freq: Once | RESPIRATORY_TRACT | Status: DC | PRN

## 2019-08-13 MED ORDER — FAMOTIDINE IN NACL 20-0.9 MG/50ML-% IV SOLN: 20.0000 mg | Freq: Once | INTRAVENOUS | Status: DC | PRN

## 2019-08-13 MED ORDER — EPINEPHRINE 0.3 MG/0.3ML IJ SOAJ: 0.3000 mg | Freq: Once | INTRAMUSCULAR | Status: DC | PRN

## 2019-08-13 MED ORDER — METHYLPREDNISOLONE SODIUM SUCC 125 MG IJ SOLR: 125.0000 mg | Freq: Once | INTRAMUSCULAR | Status: DC | PRN

## 2019-08-13 MED ORDER — SODIUM CHLORIDE 0.9 % IV SOLN
Freq: Once | INTRAVENOUS | Status: AC
  Filled 2019-08-13: qty 20

## 2019-08-13 MED ORDER — SODIUM CHLORIDE 0.9 % IV SOLN: INTRAVENOUS | Status: DC | PRN

## 2019-08-13 NOTE — Discharge Instructions (Signed)

## 2019-08-13 NOTE — Progress Notes (Signed)
  Diagnosis: COVID-19  Physician: Dr. Joya Gaskins  Procedure: Covid Infusion Clinic Med: bamlanivimab\etesevimab infusion - Provided patient with bamlanimivab\etesevimab fact sheet for patients, parents and caregivers prior to infusion.  Complications: No immediate complications noted.  Discharge: Discharged home   David Aguirre 08/13/2019

## 2019-09-23 NOTE — Progress Notes (Signed)
Established patient visit   [name]   DOB: 03-28-1975   44 y.o. Male  MRN: [id number] Visit Date: 09/28/2019  Today's healthcare provider: Wilhemena Durie, MD   Chief Complaint  Patient presents with  . Depression  . Hypogonadism   Subjective    HPI   The patient is a 44 year old male who presents for 6 month follow up of chronic health.   He feels well.  He had Covid a month ago but has recovered completely. His scores a PHQ 9 score of 4 but states that is because of shift work.  He is a Engineer, structural. Depression, Follow-up  He  was last seen on 03/24/19 for his annual physical and his PHQ at that time was 0.  He is not currently on any antidepressants.    Current symptoms include: No symptoms.  Does complain of some energy issued due to his schedule He feels he is Improved since last visit.  Depression screen [facility] 2/9 09/28/2019 03/24/2019 01/09/2018  Decreased Interest 0 0 1  Down, Depressed, Hopeless 0 0 0  PHQ - 2 Score 0 0 1  Altered sleeping 1 0 1  Tired, decreased energy 1 0 1  Change in appetite 1 0 0  Feeling bad or failure about yourself  0 0 0  Trouble concentrating 1 0 2  Moving slowly or fidgety/restless 0 0 0  Suicidal thoughts 0 0 0  PHQ-9 Score 4 0 5  Difficult doing work/chores Not difficult at all Not difficult at all Not difficult at all    -----------------------------------------------------------------------------------------  He is also here for Hypogonadism with history of low Testosterone level.  His last level was done on 03/31/19 and was normal.   At that time he reported doing well on AndroGel.   Medications: Outpatient Medications Prior to Visit  Medication Sig  . albuterol (VENTOLIN HFA) 108 (90 Base) MCG/ACT inhaler Inhale 2 puffs into the lungs every 4 (four) hours as needed for wheezing or shortness of breath.  . FIBER PO Take by mouth.  . finasteride (PROPECIA) 1 MG tablet Take 1 mg by mouth daily.  .  hydrocortisone (ANUSOL-HC) 25 MG suppository Place 1 suppository (25 mg total) rectally at bedtime.  . hydroxychloroquine (PLAQUENIL) 200 MG tablet Take 2 bid on day one, then 2 per week 7days later  for 4 weeks for positive COVID. Take it along with Zinc 50 mg daily  . MULTIPLE VITAMIN PO Take by mouth.  . Testosterone 1.62 % GEL Place 4 Squirts onto the skin daily.  Marland Kitchen triamcinolone cream (KENALOG) 0.1 % Daily at bedtime for 2 weeks   No facility-administered medications prior to visit.    Review of Systems  Constitutional: Negative for fatigue and fever.  Respiratory: Negative for cough, shortness of breath and wheezing.   Cardiovascular: Negative for chest pain, palpitations and leg swelling.  Gastrointestinal: Negative for abdominal pain and diarrhea.  Neurological: Negative for dizziness and headaches.  Psychiatric/Behavioral: Negative for agitation, behavioral problems, confusion, decreased concentration, dysphoric mood, hallucinations, self-injury, sleep disturbance and suicidal ideas. The patient is not nervous/anxious and is not hyperactive.        Objective    BP 128/84 (BP Location: Right Arm, Patient Position: Sitting, Cuff Size: Large)   Pulse 77   Temp 97.9 F (36.6 C) (Oral)   Wt (!) 261 lb (118.4 kg)   SpO2 97%   BMI 35.40 kg/m     Physical Exam Vitals  reviewed.  Constitutional:      Appearance: He is well-developed.  HENT:     Head: Normocephalic and atraumatic.  Eyes:     General: No scleral icterus.    Conjunctiva/sclera: Conjunctivae normal.  Neck:     Thyroid: No thyromegaly.  Cardiovascular:     Rate and Rhythm: Normal rate and regular rhythm.     Heart sounds: Normal heart sounds.  Pulmonary:     Effort: Pulmonary effort is normal.     Breath sounds: Normal breath sounds.  Abdominal:     Palpations: Abdomen is soft.  Musculoskeletal:     Right lower leg: No edema.     Left lower leg: No edema.  Skin:    General: Skin is warm and dry.    Neurological:     Mental Status: He is alert and oriented to person, place, and time. Mental status is at baseline.  Psychiatric:        Mood and Affect: Mood normal.        Behavior: Behavior normal.        Thought Content: Thought content normal.        Judgment: Judgment normal.       No results found for any visits on 09/28/19.  Assessment & Plan     1. Hypogonadism male Follow-up 6 months. - Testosterone  2. Hypercholesterolemia  - Lipid Panel With LDL/HDL Ratio - Comprehensive metabolic panel  3. Class 2 obesity due to excess calories without serious comorbidity with body mass index (BMI) of 37.0 to 37.9 in adult Patient had a recent Covid infection and was treated as an outpatient due to his comorbidity of obesity   No follow-ups on file.         Lacheryl Niesen Cranford Mon, MD  [facility] [phone] (phone) [phone] (fax)  [place]

## 2019-09-28 ENCOUNTER — Ambulatory Visit: Payer: PRIVATE HEALTH INSURANCE | Admitting: Family Medicine

## 2019-09-28 ENCOUNTER — Other Ambulatory Visit: Payer: Self-pay

## 2019-09-28 VITALS — BP 128/84 | HR 77 | Temp 97.9°F | Wt 261.0 lb

## 2019-09-28 DIAGNOSIS — E7800 Pure hypercholesterolemia, unspecified: Secondary | ICD-10-CM | POA: Diagnosis not present

## 2019-09-28 DIAGNOSIS — E6609 Other obesity due to excess calories: Secondary | ICD-10-CM | POA: Diagnosis not present

## 2019-09-28 DIAGNOSIS — E291 Testicular hypofunction: Secondary | ICD-10-CM

## 2019-09-28 DIAGNOSIS — Z6837 Body mass index (BMI) 37.0-37.9, adult: Secondary | ICD-10-CM

## 2019-09-29 LAB — COMPREHENSIVE METABOLIC PANEL
ALT: 30 IU/L (ref 0–44)
AST: 24 IU/L (ref 0–40)
Albumin/Globulin Ratio: 2.3 — ABNORMAL HIGH (ref 1.2–2.2)
Albumin: 4.8 g/dL (ref 4.0–5.0)
Alkaline Phosphatase: 52 IU/L (ref 48–121)
BUN/Creatinine Ratio: 18 (ref 9–20)
BUN: 21 mg/dL (ref 6–24)
Bilirubin Total: 0.8 mg/dL (ref 0.0–1.2)
CO2: 26 mmol/L (ref 20–29)
Calcium: 9.6 mg/dL (ref 8.7–10.2)
Chloride: 101 mmol/L (ref 96–106)
Creatinine, Ser: 1.14 mg/dL (ref 0.76–1.27)
GFR calc Af Amer: 91 mL/min/{1.73_m2} (ref >59)
GFR calc non Af Amer: 78 mL/min/{1.73_m2} (ref >59)
Globulin, Total: 2.1 g/dL (ref 1.5–4.5)
Glucose: 95 mg/dL (ref 65–99)
Potassium: 5.2 mmol/L (ref 3.5–5.2)
Sodium: 139 mmol/L (ref 134–144)
Total Protein: 6.9 g/dL (ref 6.0–8.5)

## 2019-09-29 LAB — TESTOSTERONE: Testosterone: 264 ng/dL (ref 264–916)

## 2019-09-29 LAB — LIPID PANEL WITH LDL/HDL RATIO
Cholesterol, Total: 205 mg/dL — ABNORMAL HIGH (ref 100–199)
HDL: 48 mg/dL (ref >39)
LDL Chol Calc (NIH): 140 mg/dL — ABNORMAL HIGH (ref 0–99)
LDL/HDL Ratio: 2.9 ratio (ref 0.0–3.6)
Triglycerides: 95 mg/dL (ref 0–149)
VLDL Cholesterol Cal: 17 mg/dL (ref 5–40)

## 2019-11-08 ENCOUNTER — Other Ambulatory Visit: Payer: Self-pay | Admitting: Family Medicine

## 2019-11-08 NOTE — Telephone Encounter (Signed)
Requested medication (s) are due for refill today: yes  Requested medication (s) are on the active medication list: yes  Last refill:  05/18/19  Future visit scheduled: yes  Notes to clinic:  med not delegated to NT to RF   Requested Prescriptions  Pending Prescriptions Disp Refills   Testosterone 1.62 % GEL [Pharmacy Med Name: TESTOSTERONE 1.62% GEL (60 PUMPS)] 300 g     Sig: APPLY 4 PUMPS TO THE AFFECTED AREA DAILY      Off-Protocol Failed - 11/08/2019  6:38 PM      Failed - Medication not assigned to a protocol, review manually.      Passed - Valid encounter within last 12 months    Recent Outpatient Visits           1 month ago Hypogonadism male   [facility] Jerrol Banana., MD   7 months ago Annual physical exam   [facility] Jerrol Banana., MD   1 year ago Hypogonadism male   [facility] Jerrol Banana., MD   1 year ago Annual physical exam   [facility] Jerrol Banana., MD   2 years ago Hypogonadism male   [facility] Jerrol Banana., MD       Future Appointments             In 5 months Jerrol Banana., MD [facility], [place]

## 2019-11-09 NOTE — Telephone Encounter (Signed)
Please advise 

## 2020-01-30 ENCOUNTER — Telehealth: Payer: Self-pay | Admitting: Family Medicine

## 2020-01-30 NOTE — Telephone Encounter (Signed)
Requested medication (s) are due for refill today: yes  Requested medication (s) are on the active medication list: yes  Last refill:  11/10/19  Future visit scheduled: yes  Notes to clinic:  med not assigned to a protocol   Requested Prescriptions  Pending Prescriptions Disp Refills   Testosterone 1.62 % GEL [Pharmacy Med Name: TESTOSTERONE 1.62% GEL (60 PUMPS)] 300 g     Sig: APPLY 4 PUMPS TOPICALLY TO THE AFFECTED AREA DAILY      Off-Protocol Failed - 01/30/2020  2:46 PM      Failed - Medication not assigned to a protocol, review manually.      Passed - Valid encounter within last 12 months    Recent Outpatient Visits           4 months ago Hypogonadism male   [facility] Jerrol Banana., MD   10 months ago Annual physical exam   [facility] Jerrol Banana., MD   1 year ago Hypogonadism male   [facility] Jerrol Banana., MD   2 years ago Annual physical exam   [facility] Jerrol Banana., MD   2 years ago Hypogonadism male   [facility] Jerrol Banana., MD       Future Appointments             In 2 months Jerrol Banana., MD [facility], [place]

## 2020-02-04 NOTE — Telephone Encounter (Signed)
Pt called back in to follow up on refill request. Pt called in to update his insurance information. Pt says that he was told by his pharmacist that a PA is needed.     Pt now has [employer]. Please assist.

## 2020-02-05 NOTE — Telephone Encounter (Signed)
Please advise 

## 2020-03-29 DIAGNOSIS — L578 Other skin changes due to chronic exposure to nonionizing radiation: Secondary | ICD-10-CM | POA: Diagnosis not present

## 2020-03-29 DIAGNOSIS — L648 Other androgenic alopecia: Secondary | ICD-10-CM | POA: Diagnosis not present

## 2020-03-29 DIAGNOSIS — Z86018 Personal history of other benign neoplasm: Secondary | ICD-10-CM | POA: Diagnosis not present

## 2020-04-07 ENCOUNTER — Ambulatory Visit (INDEPENDENT_AMBULATORY_CARE_PROVIDER_SITE_OTHER): Payer: BC Managed Care – PPO | Admitting: Family Medicine

## 2020-04-07 ENCOUNTER — Encounter: Payer: Self-pay | Admitting: Family Medicine

## 2020-04-07 ENCOUNTER — Other Ambulatory Visit: Payer: Self-pay

## 2020-04-07 VITALS — BP 112/69 | HR 73 | Temp 98.3°F | Resp 16 | Ht 72.0 in | Wt 259.0 lb

## 2020-04-07 DIAGNOSIS — Z0000 Encounter for general adult medical examination without abnormal findings: Secondary | ICD-10-CM

## 2020-04-07 DIAGNOSIS — E7800 Pure hypercholesterolemia, unspecified: Secondary | ICD-10-CM

## 2020-04-07 DIAGNOSIS — E6609 Other obesity due to excess calories: Secondary | ICD-10-CM

## 2020-04-07 DIAGNOSIS — Z1389 Encounter for screening for other disorder: Secondary | ICD-10-CM | POA: Diagnosis not present

## 2020-04-07 DIAGNOSIS — E291 Testicular hypofunction: Secondary | ICD-10-CM | POA: Diagnosis not present

## 2020-04-07 DIAGNOSIS — Z125 Encounter for screening for malignant neoplasm of prostate: Secondary | ICD-10-CM

## 2020-04-07 DIAGNOSIS — Z6837 Body mass index (BMI) 37.0-37.9, adult: Secondary | ICD-10-CM

## 2020-04-07 LAB — POCT URINALYSIS DIPSTICK
Bilirubin, UA: NEGATIVE
Blood, UA: NEGATIVE
Glucose, UA: NEGATIVE
Ketones, UA: NEGATIVE
Leukocytes, UA: NEGATIVE
Nitrite, UA: NEGATIVE
Protein, UA: NEGATIVE
Spec Grav, UA: 1.02 (ref 1.010–1.025)
Urobilinogen, UA: 0.2 E.U./dL
pH, UA: 7 (ref 5.0–8.0)

## 2020-04-07 NOTE — Progress Notes (Signed)
Complete physical exam   [name]   DOB: 27-Mar-1975   45 y.o. Male  MRN: [id number] Visit Date: 04/07/2020  Today's healthcare provider: Wilhemena Durie, MD   Chief Complaint  Patient presents with  . Annual Exam   Subjective    David Aguirre is a 45 y.o. male who presents today for a complete physical exam.  He reports consuming a general diet. Gym/ health club routine includes cardio and mod to heavy weightlifting. He generally feels well. He reports sleeping well. He does not have additional problems to discuss today.  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 He has had all 3 Covid vaccines. Does snore but no known apnea. He does see dermatology, Dr. Phillip Heal. Additional family history of father with prostate cancer at age 46.  Past Medical History:  Diagnosis Date  . Low testosterone    Past Surgical History:  Procedure Laterality Date  . CHOLECYSTECTOMY N/A 11/28/2014   Procedure: LAPAROSCOPIC CHOLECYSTECTOMY WITH INTRAOPERATIVE CHOLANGIOGRAM;  Surgeon: Florene Glen, MD;  Location: [facility] ORS;  Service: General;  Laterality: N/A;  . NASAL SEPTUM SURGERY    . NASAL SINUS SURGERY    . TONSILLECTOMY    . TURBINATE REDUCTION Bilateral    Social History   Socioeconomic History  . Marital status: Married    Spouse name: Not on file  . Number of children: Not on file  . Years of education: Not on file  . Highest education level: Not on file  Occupational History  . Not on file  Tobacco Use  . Smoking status: Former Smoker    Quit date: 02/26/1998    Years since quitting: 22.1  . Smokeless tobacco: Current User    Types: Snuff  Vaping Use  . Vaping Use: Never used  Substance and Sexual Activity  . Alcohol use: Yes    Comment: 2-3 times a week  . Drug use: No  . Sexual activity: Not on file  Other Topics Concern  . Not on file  Social History Narrative  . Not on file   Social Determinants of Health    Financial Resource Strain: Not on file  Food Insecurity: Not on file  Transportation Needs: Not on file  Physical Activity: Not on file  Stress: Not on file  Social Connections: Not on file  Intimate Partner Violence: Not on file   Family Status  Relation Name Status  . Mother  Alive  . Father  Alive  . Sister  Alive  . Brother  Alive  . PGF  (Not Specified)   Family History  Problem Relation Age of Onset  . Diabetes Mother   . Hyperlipidemia Mother   . Hyperlipidemia Father   . Prostate cancer Paternal Grandfather    No Known Allergies  Patient Care Team: Jerrol Banana., MD as PCP - General (Family Medicine)   Medications: Outpatient Medications Prior to Visit  Medication Sig  . albuterol (VENTOLIN HFA) 108 (90 Base) MCG/ACT inhaler Inhale 2 puffs into the lungs every 4 (four) hours as needed for wheezing or shortness of breath.  . FIBER PO Take by mouth.  . finasteride (PROPECIA) 1 MG tablet Take 1 mg by mouth daily.  . hydrocortisone (ANUSOL-HC) 25 MG suppository Place 1 suppository (25 mg total) rectally at bedtime.  . hydroxychloroquine (PLAQUENIL) 200 MG tablet Take 2 bid on day one, then 2 per week 7days later  for 4 weeks for  positive COVID. Take it along with Zinc 50 mg daily  . MULTIPLE VITAMIN PO Take by mouth.  . Testosterone 1.62 % GEL APPLY 4 PUMPS TOPICALLY TO THE AFFECTED AREA DAILY  . triamcinolone cream (KENALOG) 0.1 % Daily at bedtime for 2 weeks   No facility-administered medications prior to visit.    Review of Systems  All other systems reviewed and are negative.      Objective    BP 112/69   Pulse 73   Temp 98.3 F (36.8 C)   Resp 16   Ht 6' (1.829 m)   Wt 259 lb (117.5 kg)   BMI 35.13 kg/m  BP Readings from Last 3 Encounters:  04/07/20 112/69  09/28/19 128/84  08/13/19 131/88   Wt Readings from Last 3 Encounters:  04/07/20 259 lb (117.5 kg)  09/28/19 (!) 261 lb (118.4 kg)  08/12/19 271 lb 2.7 oz (123 kg)       Physical Exam Vitals reviewed.  Constitutional:      Appearance: He is well-developed.  HENT:     Head: Normocephalic and atraumatic.     Right Ear: External ear normal.     Left Ear: External ear normal.     Nose: Nose normal.  Eyes:     General: No scleral icterus.    Conjunctiva/sclera: Conjunctivae normal.     Pupils: Pupils are equal, round, and reactive to light.  Neck:     Thyroid: No thyromegaly.     Vascular: No carotid bruit.  Cardiovascular:     Rate and Rhythm: Normal rate and regular rhythm.     Heart sounds: Normal heart sounds.  Pulmonary:     Effort: Pulmonary effort is normal.     Breath sounds: Normal breath sounds.  Abdominal:     Palpations: Abdomen is soft.  Genitourinary:    Penis: Normal.      Testes: Normal.     Prostate: Normal.     Rectum: Normal. Guaiac result negative.     Comments: Perianal exam normal. There is what appears to be a tiny cyst on the  edge of the right lobe of the prostate. Lymphadenopathy:     Cervical: No cervical adenopathy.  Skin:    General: Skin is warm and dry.     Comments: Atypical nevus on back.  Neurological:     General: No focal deficit present.     Mental Status: He is alert and oriented to person, place, and time.  Psychiatric:        Mood and Affect: Mood normal.        Behavior: Behavior normal.        Thought Content: Thought content normal.        Judgment: Judgment normal.       Last depression screening scores PHQ 2/9 Scores 04/07/2020 09/28/2019 03/24/2019  PHQ - 2 Score 0 0 0  PHQ- 9 Score 0 4 0   Last fall risk screening Fall Risk  04/07/2020  Falls in the past year? 0  Number falls in past yr: 0  Injury with Fall? 0  Risk for fall due to : No Fall Risks  Follow up Falls evaluation completed   Last Audit-C alcohol use screening Alcohol Use Disorder Test (AUDIT) 04/07/2020  1. How often do you have a drink containing alcohol? 4  2. How many drinks containing alcohol do you have on a  typical day when you are drinking? 0  3. How often do you have six  or more drinks on one occasion? 1  AUDIT-C Score 5  4. How often during the last year have you found that you were not able to stop drinking once you had started? 0  5. How often during the last year have you failed to do what was normally expected from you because of drinking? 0  6. How often during the last year have you needed a first drink in the morning to get yourself going after a heavy drinking session? 0  7. How often during the last year have you had a feeling of guilt of remorse after drinking? 0  8. How often during the last year have you been unable to remember what happened the night before because you had been drinking? 0  9. Have you or someone else been injured as a result of your drinking? 0  10. Has a relative or friend or a doctor or another health worker been concerned about your drinking or suggested you cut down? 0  Alcohol Use Disorder Identification Test Final Score (AUDIT) 5  Alcohol Brief Interventions/Follow-up AUDIT Score <7 follow-up not indicated   A score of 3 or more in women, and 4 or more in men indicates increased risk for alcohol abuse, EXCEPT if all of the points are from question 1   No results found for any visits on 04/07/20.  Assessment & Plan    Routine Health Maintenance and Physical Exam  Exercise Activities and Dietary recommendations Goals   None     Immunization History  Administered Date(s) Administered  . Influenza Inj Mdck Quad With Preservative 12/11/2017  . PFIZER(Purple Top)SARS-COV-2 Vaccination 11/19/2019, 12/14/2019    Health Maintenance  Topic Date Due  . Hepatitis C Screening  Never done  . HIV Screening  Never done  . TETANUS/TDAP  Never done  . INFLUENZA VACCINE  09/27/2019  . COVID-19 Vaccine (3 - Booster for Pfizer series) 06/13/2020    Discussed health benefits of physical activity, and encouraged him to engage in regular exercise appropriate for  his age and condition.  1. Annual physical exam  - Lipid panel - TSH - CBC w/Diff/Platelet - Comprehensive Metabolic Panel (CMET) - Hepatitis C antibody - HIV antibody (with reflex)  2. Hypercholesterolemia   3. Class 2 obesity due to excess calories without serious comorbidity with body mass index (BMI) of 37.0 to 37.9 in adult   4. Hypogonadism male  - Testosterone  5. Prostate cancer screening If PSA abnormal and I will refer to urology. - PSA  6. Screening for blood or protein in urine  - POCT urinalysis dipstick   No follow-ups on file.     I, Wilhemena Durie, MD, have reviewed all documentation for this visit. The documentation on 04/12/20 for the exam, diagnosis, procedures, and orders are all accurate and complete.    Kambrey Hagger Cranford Mon, MD  [facility] [phone] (phone) [phone] (fax)  [place]

## 2020-04-21 DIAGNOSIS — Z0000 Encounter for general adult medical examination without abnormal findings: Secondary | ICD-10-CM | POA: Diagnosis not present

## 2020-04-21 DIAGNOSIS — Z125 Encounter for screening for malignant neoplasm of prostate: Secondary | ICD-10-CM | POA: Diagnosis not present

## 2020-04-21 DIAGNOSIS — E291 Testicular hypofunction: Secondary | ICD-10-CM | POA: Diagnosis not present

## 2020-04-22 LAB — CBC WITH DIFFERENTIAL/PLATELET
Basophils Absolute: 0.1 10*3/uL (ref 0.0–0.2)
Basos: 1 %
EOS (ABSOLUTE): 0.1 10*3/uL (ref 0.0–0.4)
Eos: 2 %
Hematocrit: 49.5 % (ref 37.5–51.0)
Hemoglobin: 16.6 g/dL (ref 13.0–17.7)
Immature Grans (Abs): 0 10*3/uL (ref 0.0–0.1)
Immature Granulocytes: 0 %
Lymphocytes Absolute: 2 10*3/uL (ref 0.7–3.1)
Lymphs: 34 %
MCH: 29.3 pg (ref 26.6–33.0)
MCHC: 33.5 g/dL (ref 31.5–35.7)
MCV: 88 fL (ref 79–97)
Monocytes Absolute: 0.5 10*3/uL (ref 0.1–0.9)
Monocytes: 9 %
Neutrophils Absolute: 3.2 10*3/uL (ref 1.4–7.0)
Neutrophils: 54 %
Platelets: 273 10*3/uL (ref 150–450)
RBC: 5.66 x10E6/uL (ref 4.14–5.80)
RDW: 12.4 % (ref 11.6–15.4)
WBC: 5.9 10*3/uL (ref 3.4–10.8)

## 2020-04-22 LAB — COMPREHENSIVE METABOLIC PANEL
ALT: 17 IU/L (ref 0–44)
AST: 19 IU/L (ref 0–40)
Albumin/Globulin Ratio: 2 (ref 1.2–2.2)
Albumin: 4.6 g/dL (ref 4.0–5.0)
Alkaline Phosphatase: 47 IU/L (ref 44–121)
BUN/Creatinine Ratio: 14 (ref 9–20)
BUN: 20 mg/dL (ref 6–24)
Bilirubin Total: 0.8 mg/dL (ref 0.0–1.2)
CO2: 25 mmol/L (ref 20–29)
Calcium: 9.8 mg/dL (ref 8.7–10.2)
Chloride: 99 mmol/L (ref 96–106)
Creatinine, Ser: 1.46 mg/dL — ABNORMAL HIGH (ref 0.76–1.27)
GFR calc Af Amer: 67 mL/min/{1.73_m2} (ref >59)
GFR calc non Af Amer: 58 mL/min/{1.73_m2} — ABNORMAL LOW (ref >59)
Globulin, Total: 2.3 g/dL (ref 1.5–4.5)
Glucose: 92 mg/dL (ref 65–99)
Potassium: 4.9 mmol/L (ref 3.5–5.2)
Sodium: 143 mmol/L (ref 134–144)
Total Protein: 6.9 g/dL (ref 6.0–8.5)

## 2020-04-22 LAB — HEPATITIS C ANTIBODY: Hep C Virus Ab: <0.1 s/co ratio (ref 0.0–0.9)

## 2020-04-22 LAB — LIPID PANEL
Chol/HDL Ratio: 4.4 ratio (ref 0.0–5.0)
Cholesterol, Total: 186 mg/dL (ref 100–199)
HDL: 42 mg/dL (ref >39)
LDL Chol Calc (NIH): 122 mg/dL — ABNORMAL HIGH (ref 0–99)
Triglycerides: 120 mg/dL (ref 0–149)
VLDL Cholesterol Cal: 22 mg/dL (ref 5–40)

## 2020-04-22 LAB — PSA: Prostate Specific Ag, Serum: 1 ng/mL (ref 0.0–4.0)

## 2020-04-22 LAB — HIV ANTIBODY (ROUTINE TESTING W REFLEX): HIV Screen 4th Generation wRfx: NONREACTIVE

## 2020-04-22 LAB — TSH: TSH: 1.18 u[IU]/mL (ref 0.450–4.500)

## 2020-04-22 LAB — TESTOSTERONE: Testosterone: 228 ng/dL — ABNORMAL LOW (ref 264–916)

## 2020-05-18 ENCOUNTER — Encounter: Payer: Self-pay | Admitting: Family Medicine

## 2020-06-07 ENCOUNTER — Telehealth: Payer: Self-pay | Admitting: Family Medicine

## 2020-06-07 DIAGNOSIS — R7989 Other specified abnormal findings of blood chemistry: Secondary | ICD-10-CM

## 2020-06-07 DIAGNOSIS — E291 Testicular hypofunction: Secondary | ICD-10-CM

## 2020-06-07 MED ORDER — TESTOSTERONE 1.62 % TD GEL: TRANSDERMAL | 3 refills | Status: DC

## 2020-06-07 NOTE — Addendum Note (Signed)
Addended by: Julieta Bellini on: 06/07/2020 03:42 PM   Modules accepted: Orders

## 2020-06-07 NOTE — Telephone Encounter (Signed)
Pt called stating that he is needing to have a PA for his testosterone prescription. Pt states that he is completely out x1 wk. Please advise.      [id number] Phillip Heal, [place] AT [facility] OF [address]  [place] [address]  Phone: [phone] Fax: [phone]  Hours: Not open 24 hours

## 2020-06-07 NOTE — Telephone Encounter (Signed)
PA was started.

## 2020-06-09 NOTE — Telephone Encounter (Signed)
As of today PA is still pending. Will await response.

## 2020-06-14 NOTE — Telephone Encounter (Signed)
PA was approved. 

## 2020-08-18 DIAGNOSIS — M542 Cervicalgia: Secondary | ICD-10-CM | POA: Diagnosis not present

## 2020-08-18 DIAGNOSIS — M5413 Radiculopathy, cervicothoracic region: Secondary | ICD-10-CM | POA: Diagnosis not present

## 2020-08-18 DIAGNOSIS — M9901 Segmental and somatic dysfunction of cervical region: Secondary | ICD-10-CM | POA: Diagnosis not present

## 2020-08-18 DIAGNOSIS — M62830 Muscle spasm of back: Secondary | ICD-10-CM | POA: Diagnosis not present

## 2020-08-22 DIAGNOSIS — M62830 Muscle spasm of back: Secondary | ICD-10-CM | POA: Diagnosis not present

## 2020-08-22 DIAGNOSIS — M9901 Segmental and somatic dysfunction of cervical region: Secondary | ICD-10-CM | POA: Diagnosis not present

## 2020-08-22 DIAGNOSIS — M542 Cervicalgia: Secondary | ICD-10-CM | POA: Diagnosis not present

## 2020-08-22 DIAGNOSIS — M546 Pain in thoracic spine: Secondary | ICD-10-CM | POA: Diagnosis not present

## 2020-08-22 DIAGNOSIS — M5413 Radiculopathy, cervicothoracic region: Secondary | ICD-10-CM | POA: Diagnosis not present

## 2020-08-25 DIAGNOSIS — M542 Cervicalgia: Secondary | ICD-10-CM | POA: Diagnosis not present

## 2020-08-25 DIAGNOSIS — M62830 Muscle spasm of back: Secondary | ICD-10-CM | POA: Diagnosis not present

## 2020-08-25 DIAGNOSIS — M9901 Segmental and somatic dysfunction of cervical region: Secondary | ICD-10-CM | POA: Diagnosis not present

## 2020-08-25 DIAGNOSIS — M5413 Radiculopathy, cervicothoracic region: Secondary | ICD-10-CM | POA: Diagnosis not present

## 2020-08-26 DIAGNOSIS — M5412 Radiculopathy, cervical region: Secondary | ICD-10-CM | POA: Diagnosis not present

## 2020-08-26 DIAGNOSIS — M546 Pain in thoracic spine: Secondary | ICD-10-CM | POA: Diagnosis not present

## 2020-08-31 DIAGNOSIS — M62830 Muscle spasm of back: Secondary | ICD-10-CM | POA: Diagnosis not present

## 2020-08-31 DIAGNOSIS — M542 Cervicalgia: Secondary | ICD-10-CM | POA: Diagnosis not present

## 2020-08-31 DIAGNOSIS — M9901 Segmental and somatic dysfunction of cervical region: Secondary | ICD-10-CM | POA: Diagnosis not present

## 2020-08-31 DIAGNOSIS — M5413 Radiculopathy, cervicothoracic region: Secondary | ICD-10-CM | POA: Diagnosis not present

## 2020-09-07 DIAGNOSIS — M542 Cervicalgia: Secondary | ICD-10-CM | POA: Diagnosis not present

## 2020-09-07 DIAGNOSIS — M9901 Segmental and somatic dysfunction of cervical region: Secondary | ICD-10-CM | POA: Diagnosis not present

## 2020-09-07 DIAGNOSIS — M5413 Radiculopathy, cervicothoracic region: Secondary | ICD-10-CM | POA: Diagnosis not present

## 2020-09-07 DIAGNOSIS — M62830 Muscle spasm of back: Secondary | ICD-10-CM | POA: Diagnosis not present

## 2020-09-12 ENCOUNTER — Other Ambulatory Visit: Payer: Self-pay | Admitting: Family Medicine

## 2020-09-12 NOTE — Telephone Encounter (Signed)
Requested medications are due for refill today.  Unknown  Requested medications are on the active medications list.  no  Last refill. 06/13/2020  Future visit scheduled.   yes  Notes to clinic.  Per note med was D/C 06/07/2020. Pt called b/c he needed a PA for further refills. Please advise.

## 2020-09-14 DIAGNOSIS — M9901 Segmental and somatic dysfunction of cervical region: Secondary | ICD-10-CM | POA: Diagnosis not present

## 2020-09-14 DIAGNOSIS — M62830 Muscle spasm of back: Secondary | ICD-10-CM | POA: Diagnosis not present

## 2020-09-14 DIAGNOSIS — M542 Cervicalgia: Secondary | ICD-10-CM | POA: Diagnosis not present

## 2020-09-14 DIAGNOSIS — M5413 Radiculopathy, cervicothoracic region: Secondary | ICD-10-CM | POA: Diagnosis not present

## 2020-10-05 NOTE — Progress Notes (Signed)
Established patient visit   [name]   DOB: February 03, 1976   45 y.o. Male  MRN: [id number] Visit Date: 10/06/2020  Today's healthcare provider: Wilhemena Durie, MD   Chief Complaint  Patient presents with   Hyperlipidemia   Hypogonadism   Subjective    HPI  Patient is feeling well and has no complaints.  He is enjoying his new job change. Needs some follow-up labs. Lipid/Cholesterol, Follow-up  Last lipid panel Other pertinent labs  Lab Results  Component Value Date   CHOL 186 04/21/2020   HDL 42 04/21/2020   LDLCALC 122 (H) 04/21/2020   TRIG 120 04/21/2020   CHOLHDL 4.4 04/21/2020   Lab Results  Component Value Date   ALT 17 04/21/2020   AST 19 04/21/2020   PLT 273 04/21/2020   TSH 1.180 04/21/2020     He was last seen for this 6 months ago.  Management since that visit includes no medication changes.  He reports good compliance with treatment. He is not having side effects.   Symptoms: No chest pain No chest pressure/discomfort  No dyspnea No lower extremity edema  No numbness or tingling of extremity No orthopnea  No palpitations No paroxysmal nocturnal dyspnea  No speech difficulty No syncope   Current diet: well balanced Current exercise: running/ jogging and weightlifting  The 10-year ASCVD risk score Mikey Bussing DC Jr., et al., 2013) is: 2%  Follow up for hypogonadism   The patient was last seen for this 6 months ago. Changes made at last visit include no medication changes. Continue testosterone gel.  He reports good compliance with treatment. He feels that condition is Unchanged. He is not having side effects.   -----------------------------------------------------------------------------------------       Medications: Outpatient Medications Prior to Visit  Medication Sig   albuterol (VENTOLIN HFA) 108 (90 Base) MCG/ACT inhaler Inhale 2 puffs into the lungs every 4 (four) hours as needed for wheezing or shortness of  breath.   FIBER PO Take by mouth.   finasteride (PROPECIA) 1 MG tablet Take 1 mg by mouth daily.   hydrocortisone (ANUSOL-HC) 25 MG suppository Place 1 suppository (25 mg total) rectally at bedtime.   hydroxychloroquine (PLAQUENIL) 200 MG tablet Take 2 bid on day one, then 2 per week 7days later  for 4 weeks for positive COVID. Take it along with Zinc 50 mg daily   MULTIPLE VITAMIN PO Take by mouth.   Testosterone 1.62 % GEL APPLY 4 PUMPS TOPICALLY TO THE AFFECTED AREA DAILY   triamcinolone cream (KENALOG) 0.1 % Daily at bedtime for 2 weeks   No facility-administered medications prior to visit.    Review of Systems  Constitutional:  Negative for appetite change, chills and fever.  Respiratory:  Negative for chest tightness, shortness of breath and wheezing.   Cardiovascular:  Negative for chest pain and palpitations.  Gastrointestinal:  Negative for abdominal pain, nausea and vomiting.       Objective    BP 127/84 (BP Location: Left Arm, Patient Position: Sitting, Cuff Size: Large)   Pulse 67   Temp (!) 97.3 F (36.3 C) (Temporal)   Resp 16   Wt 268 lb (121.6 kg)   BMI 36.35 kg/m  BP Readings from Last 3 Encounters:  10/06/20 127/84  04/07/20 112/69  09/28/19 128/84   Wt Readings from Last 3 Encounters:  10/06/20 268 lb (121.6 kg)  04/07/20 259 lb (117.5 kg)  09/28/19 (!) 261 lb (118.4 kg)  Physical Exam Vitals reviewed.  Constitutional:      Appearance: Normal appearance. He is well-developed.  HENT:     Head: Normocephalic and atraumatic.  Eyes:     General: No scleral icterus.    Conjunctiva/sclera: Conjunctivae normal.  Neck:     Thyroid: No thyromegaly.  Cardiovascular:     Rate and Rhythm: Normal rate and regular rhythm.     Heart sounds: Normal heart sounds.  Pulmonary:     Effort: Pulmonary effort is normal.     Breath sounds: Normal breath sounds.  Abdominal:     Palpations: Abdomen is soft.  Musculoskeletal:     Right lower leg: No edema.      Left lower leg: No edema.  Skin:    General: Skin is warm and dry.  Neurological:     Mental Status: He is alert and oriented to person, place, and time. Mental status is at baseline.  Psychiatric:        Mood and Affect: Mood normal.        Behavior: Behavior normal.        Thought Content: Thought content normal.        Judgment: Judgment normal.      No results found for any visits on 10/06/20.  Assessment & Plan     1. Hypogonadism male Follow-up in 6 months for CPE and we will follow-up PSA in testosterone level and other labs. - Testosterone  2. Hypercholesterolemia Follow-up at CPE  3. Abnormal kidney function Renal function today.  Avoid nonsteroidals.  Stay hydrated. - Renal Function Panel   No follow-ups on file.      I, Wilhemena Durie, MD, have reviewed all documentation for this visit. The documentation on 10/06/20 for the exam, diagnosis, procedures, and orders are all accurate and complete.    Takaya Hyslop Cranford Mon, MD  [facility] [phone] (phone) [phone] (fax)  [place]

## 2020-10-06 ENCOUNTER — Ambulatory Visit: Payer: BC Managed Care – PPO | Admitting: Family Medicine

## 2020-10-06 ENCOUNTER — Encounter: Payer: Self-pay | Admitting: Family Medicine

## 2020-10-06 ENCOUNTER — Ambulatory Visit: Payer: Self-pay | Admitting: Family Medicine

## 2020-10-06 ENCOUNTER — Other Ambulatory Visit: Payer: Self-pay

## 2020-10-06 VITALS — BP 127/84 | HR 67 | Temp 97.3°F | Resp 16 | Wt 268.0 lb

## 2020-10-06 DIAGNOSIS — E7800 Pure hypercholesterolemia, unspecified: Secondary | ICD-10-CM | POA: Diagnosis not present

## 2020-10-06 DIAGNOSIS — E291 Testicular hypofunction: Secondary | ICD-10-CM

## 2020-10-06 DIAGNOSIS — N289 Disorder of kidney and ureter, unspecified: Secondary | ICD-10-CM | POA: Diagnosis not present

## 2020-10-10 DIAGNOSIS — M546 Pain in thoracic spine: Secondary | ICD-10-CM | POA: Diagnosis not present

## 2020-10-10 DIAGNOSIS — M542 Cervicalgia: Secondary | ICD-10-CM | POA: Diagnosis not present

## 2020-10-10 DIAGNOSIS — M4722 Other spondylosis with radiculopathy, cervical region: Secondary | ICD-10-CM | POA: Diagnosis not present

## 2020-10-21 DIAGNOSIS — N289 Disorder of kidney and ureter, unspecified: Secondary | ICD-10-CM | POA: Diagnosis not present

## 2020-10-21 DIAGNOSIS — E291 Testicular hypofunction: Secondary | ICD-10-CM | POA: Diagnosis not present

## 2020-10-22 LAB — RENAL FUNCTION PANEL
Albumin: 4.7 g/dL (ref 4.0–5.0)
BUN/Creatinine Ratio: 14 (ref 9–20)
BUN: 18 mg/dL (ref 6–24)
CO2: 23 mmol/L (ref 20–29)
Calcium: 9.7 mg/dL (ref 8.7–10.2)
Chloride: 99 mmol/L (ref 96–106)
Creatinine, Ser: 1.32 mg/dL — ABNORMAL HIGH (ref 0.76–1.27)
Glucose: 94 mg/dL (ref 65–99)
Phosphorus: 2.6 mg/dL — ABNORMAL LOW (ref 2.8–4.1)
Potassium: 5 mmol/L (ref 3.5–5.2)
Sodium: 139 mmol/L (ref 134–144)
eGFR: 68 mL/min/{1.73_m2} (ref >59)

## 2020-10-22 LAB — TESTOSTERONE: Testosterone: 135 ng/dL — ABNORMAL LOW (ref 264–916)

## 2020-10-28 ENCOUNTER — Encounter: Payer: Self-pay | Admitting: Family Medicine

## 2020-11-04 ENCOUNTER — Other Ambulatory Visit: Payer: Self-pay

## 2020-11-04 MED ORDER — CLOMIPHENE CITRATE 50 MG PO TABS: ORAL_TABLET | ORAL | 5 refills | Status: DC

## 2021-01-15 ENCOUNTER — Encounter: Payer: Self-pay | Admitting: Family Medicine

## 2021-01-23 ENCOUNTER — Other Ambulatory Visit: Payer: Self-pay | Admitting: Family Medicine

## 2021-01-23 MED ORDER — TESTOSTERONE 1.62 % TD GEL: TRANSDERMAL | 3 refills | Status: DC

## 2021-01-23 NOTE — Telephone Encounter (Signed)
Please send refill on testosterone gel per patient request.

## 2021-01-30 DIAGNOSIS — M62830 Muscle spasm of back: Secondary | ICD-10-CM | POA: Diagnosis not present

## 2021-01-30 DIAGNOSIS — M9903 Segmental and somatic dysfunction of lumbar region: Secondary | ICD-10-CM | POA: Diagnosis not present

## 2021-01-30 DIAGNOSIS — M9905 Segmental and somatic dysfunction of pelvic region: Secondary | ICD-10-CM | POA: Diagnosis not present

## 2021-01-30 DIAGNOSIS — M545 Low back pain, unspecified: Secondary | ICD-10-CM | POA: Diagnosis not present

## 2021-03-07 DIAGNOSIS — M9905 Segmental and somatic dysfunction of pelvic region: Secondary | ICD-10-CM | POA: Diagnosis not present

## 2021-03-07 DIAGNOSIS — M545 Low back pain, unspecified: Secondary | ICD-10-CM | POA: Diagnosis not present

## 2021-03-07 DIAGNOSIS — M9903 Segmental and somatic dysfunction of lumbar region: Secondary | ICD-10-CM | POA: Diagnosis not present

## 2021-03-07 DIAGNOSIS — M62830 Muscle spasm of back: Secondary | ICD-10-CM | POA: Diagnosis not present

## 2021-03-14 DIAGNOSIS — M9905 Segmental and somatic dysfunction of pelvic region: Secondary | ICD-10-CM | POA: Diagnosis not present

## 2021-03-14 DIAGNOSIS — M62830 Muscle spasm of back: Secondary | ICD-10-CM | POA: Diagnosis not present

## 2021-03-14 DIAGNOSIS — M545 Low back pain, unspecified: Secondary | ICD-10-CM | POA: Diagnosis not present

## 2021-03-14 DIAGNOSIS — M9903 Segmental and somatic dysfunction of lumbar region: Secondary | ICD-10-CM | POA: Diagnosis not present

## 2021-03-28 ENCOUNTER — Encounter: Payer: Self-pay | Admitting: Family Medicine

## 2021-04-04 DIAGNOSIS — M9902 Segmental and somatic dysfunction of thoracic region: Secondary | ICD-10-CM | POA: Diagnosis not present

## 2021-04-04 DIAGNOSIS — M62830 Muscle spasm of back: Secondary | ICD-10-CM | POA: Diagnosis not present

## 2021-04-04 DIAGNOSIS — M9901 Segmental and somatic dysfunction of cervical region: Secondary | ICD-10-CM | POA: Diagnosis not present

## 2021-04-04 DIAGNOSIS — M542 Cervicalgia: Secondary | ICD-10-CM | POA: Diagnosis not present

## 2021-04-17 ENCOUNTER — Encounter: Payer: BC Managed Care – PPO | Admitting: Family Medicine

## 2021-04-19 NOTE — Progress Notes (Signed)
[place] Ponder [place],acting as a scribe for Gwyneth Sprout, FNP.,have documented all relevant documentation on the behalf of Gwyneth Sprout, FNP,as directed by  Gwyneth Sprout, FNP while in the presence of Gwyneth Sprout, FNP.    Established patient visit   [name]   DOB: 01-06-1976   46 y.o. Male  MRN: [id number] Visit Date: 04/20/2021  Today's healthcare provider: Gwyneth Sprout, FNP   No chief complaint on file.  Subjective    HPI  Patient is a 72 male who presents to discuss option for treatment of hypogonadism.  He had been taking Testosterone and was changed to Clomid.  While on Clomid he had continued lethrgy and decreased libido.  Patient then went back on testosterone he continues to have decreased libido but now he has testicular atrophy. Patient wants to discuss a the next plan of treatment  Medications: Outpatient Medications Prior to Visit  Medication Sig   albuterol (VENTOLIN HFA) 108 (90 Base) MCG/ACT inhaler Inhale 2 puffs into the lungs every 4 (four) hours as needed for wheezing or shortness of breath.   FIBER PO Take by mouth.   finasteride (PROPECIA) 1 MG tablet Take 1 mg by mouth daily.   hydrocortisone (ANUSOL-HC) 25 MG suppository Place 1 suppository (25 mg total) rectally at bedtime.   hydroxychloroquine (PLAQUENIL) 200 MG tablet Take 2 bid on day one, then 2 per week 7days later  for 4 weeks for positive COVID. Take it along with Zinc 50 mg daily   MULTIPLE VITAMIN PO Take by mouth.   triamcinolone cream (KENALOG) 0.1 % Daily at bedtime for 2 weeks   [DISCONTINUED] Testosterone 1.62 % GEL APPLY 4 PUMPS TOPICALLY TO THE AFFECTED AREA DAILY   clomiPHENE (CLOMID) 50 MG tablet Take 1-2 tablets by mouth daily. (Patient not taking: Reported on 04/20/2021)   No facility-administered medications prior to visit.    Review of Systems  Respiratory:  Negative for shortness of breath.   Cardiovascular:  Negative for chest pain, palpitations and leg  swelling.  Genitourinary:  Negative for difficulty urinating, dysuria, frequency, penile pain, penile swelling, scrotal swelling and testicular pain.      Objective    BP 133/84 (BP Location: Right Arm, Patient Position: Sitting, Cuff Size: Large)    Pulse 73    Temp 98.6 F (37 C) (Oral)    Wt 260 lb (117.9 kg)    SpO2 98%    BMI 35.26 kg/m    Physical Exam Vitals and nursing note reviewed.  Constitutional:      Appearance: Normal appearance. He is obese.  HENT:     Head: Normocephalic and atraumatic.  Eyes:     Pupils: Pupils are equal, round, and reactive to light.  Cardiovascular:     Rate and Rhythm: Normal rate and regular rhythm.     Pulses: Normal pulses.     Heart sounds: Normal heart sounds.  Pulmonary:     Effort: Pulmonary effort is normal.     Breath sounds: Normal breath sounds.  Musculoskeletal:        General: Normal range of motion.     Cervical back: Normal range of motion.  Skin:    General: Skin is warm and dry.     Capillary Refill: Capillary refill takes less than 2 seconds.  Neurological:     General: No focal deficit present.     Mental Status: He is alert and oriented to person, place, and time. Mental  status is at baseline.  Psychiatric:        Mood and Affect: Mood normal.        Behavior: Behavior normal.        Thought Content: Thought content normal.        Judgment: Judgment normal.     No results found for any visits on 04/20/21.  Assessment & Plan     Problem List Items Addressed This Visit       Endocrine   Hypogonadism male - Primary    History of hypergonadism Has been on multiple medications for variety of years Short-term refill provided at this time Regular lab work completed Encouraged referral to urology for further discussion      Relevant Medications   Testosterone 1.62 % GEL   Other Relevant Orders   Testosterone,Free and Total   Comprehensive metabolic panel   Lipid panel   Prolactin   Iron, TIBC and  Ferritin Panel   PSA   Estradiol   TSH+T4F+T3Free   Ambulatory referral to Urology     Genitourinary   Bilateral testicular atrophy    Patient reports complaints of bilateral testicular atrophy due to use of chronic testosterone Patient educated that this is a normal side effect of continued use of medication Patient currently denies morning erections Able to sustain erection throughout the day throughout other means Continue lab monitoring 1 month refill provided of topical testosterone Refill to urology for additional resources      Relevant Medications   Testosterone 1.62 % GEL   Other Relevant Orders   Testosterone,Free and Total   Comprehensive metabolic panel   Lipid panel   Prolactin   Iron, TIBC and Ferritin Panel   PSA   Estradiol   TSH+T4F+T3Free   Ambulatory referral to Urology     Other   Low testosterone    History of low testosterone has been on both topical testosterone as well as Clomid Recently off Clomid due to complaints of lethargy Referral placed to urology at this time      Relevant Medications   Testosterone 1.62 % GEL   Other Relevant Orders   Testosterone,Free and Total   Comprehensive metabolic panel   Lipid panel   Prolactin   Iron, TIBC and Ferritin Panel   PSA   Estradiol   TSH+T4F+T3Free   Ambulatory referral to Urology     Return for annual examination.      Vonna Kotyk, FNP, have reviewed all documentation for this visit. The documentation on 04/20/21 for the exam, diagnosis, procedures, and orders are all accurate and complete.    Gwyneth Sprout, [place] [phone] (phone) [phone] (fax)  [place]

## 2021-04-20 ENCOUNTER — Encounter: Payer: Self-pay | Admitting: Family Medicine

## 2021-04-20 ENCOUNTER — Ambulatory Visit: Payer: BC Managed Care – PPO | Admitting: Family Medicine

## 2021-04-20 ENCOUNTER — Other Ambulatory Visit: Payer: Self-pay

## 2021-04-20 VITALS — BP 133/84 | HR 73 | Temp 98.6°F | Wt 260.0 lb

## 2021-04-20 DIAGNOSIS — E291 Testicular hypofunction: Secondary | ICD-10-CM

## 2021-04-20 DIAGNOSIS — N500 Atrophy of testis: Secondary | ICD-10-CM | POA: Diagnosis not present

## 2021-04-20 DIAGNOSIS — R7989 Other specified abnormal findings of blood chemistry: Secondary | ICD-10-CM | POA: Diagnosis not present

## 2021-04-20 MED ORDER — TESTOSTERONE 1.62 % TD GEL: TRANSDERMAL | 0 refills | Status: DC

## 2021-04-20 NOTE — Assessment & Plan Note (Signed)
History of hypergonadism Has been on multiple medications for variety of years Short-term refill provided at this time Regular lab work completed Encouraged referral to urology for further discussion

## 2021-04-20 NOTE — Assessment & Plan Note (Signed)
Patient reports complaints of bilateral testicular atrophy due to use of chronic testosterone Patient educated that this is a normal side effect of continued use of medication Patient currently denies morning erections Able to sustain erection throughout the day throughout other means Continue lab monitoring 1 month refill provided of topical testosterone Refill to urology for additional resources

## 2021-04-20 NOTE — Assessment & Plan Note (Signed)
History of low testosterone has been on both topical testosterone as well as Clomid Recently off Clomid due to complaints of lethargy Referral placed to urology at this time

## 2021-04-21 DIAGNOSIS — E291 Testicular hypofunction: Secondary | ICD-10-CM | POA: Diagnosis not present

## 2021-04-21 DIAGNOSIS — R7989 Other specified abnormal findings of blood chemistry: Secondary | ICD-10-CM | POA: Diagnosis not present

## 2021-04-21 DIAGNOSIS — N500 Atrophy of testis: Secondary | ICD-10-CM | POA: Diagnosis not present

## 2021-04-28 LAB — COMPREHENSIVE METABOLIC PANEL
ALT: 17 IU/L (ref 0–44)
AST: 19 IU/L (ref 0–40)
Albumin/Globulin Ratio: 2.4 — ABNORMAL HIGH (ref 1.2–2.2)
Albumin: 4.7 g/dL (ref 4.0–5.0)
Alkaline Phosphatase: 44 IU/L (ref 44–121)
BUN/Creatinine Ratio: 20 (ref 9–20)
BUN: 26 mg/dL — ABNORMAL HIGH (ref 6–24)
Bilirubin Total: 0.5 mg/dL (ref 0.0–1.2)
CO2: 24 mmol/L (ref 20–29)
Calcium: 9.2 mg/dL (ref 8.7–10.2)
Chloride: 104 mmol/L (ref 96–106)
Creatinine, Ser: 1.33 mg/dL — ABNORMAL HIGH (ref 0.76–1.27)
Globulin, Total: 2 g/dL (ref 1.5–4.5)
Glucose: 104 mg/dL — ABNORMAL HIGH (ref 70–99)
Potassium: 5 mmol/L (ref 3.5–5.2)
Sodium: 141 mmol/L (ref 134–144)
Total Protein: 6.7 g/dL (ref 6.0–8.5)
eGFR: 68 mL/min/{1.73_m2} (ref >59)

## 2021-04-28 LAB — LIPID PANEL
Chol/HDL Ratio: 4.6 ratio (ref 0.0–5.0)
Cholesterol, Total: 165 mg/dL (ref 100–199)
HDL: 36 mg/dL — ABNORMAL LOW (ref >39)
LDL Chol Calc (NIH): 113 mg/dL — ABNORMAL HIGH (ref 0–99)
Triglycerides: 82 mg/dL (ref 0–149)
VLDL Cholesterol Cal: 16 mg/dL (ref 5–40)

## 2021-04-28 LAB — ESTRADIOL: Estradiol: 36.9 pg/mL (ref 7.6–42.6)

## 2021-04-28 LAB — TESTOSTERONE,FREE AND TOTAL
Testosterone, Free: 4.2 pg/mL — ABNORMAL LOW (ref 6.8–21.5)
Testosterone: 210 ng/dL — ABNORMAL LOW (ref 264–916)

## 2021-04-28 LAB — IRON,TIBC AND FERRITIN PANEL
Ferritin: 146 ng/mL (ref 30–400)
Iron Saturation: 24 % (ref 15–55)
Iron: 62 ug/dL (ref 38–169)
Total Iron Binding Capacity: 254 ug/dL (ref 250–450)
UIBC: 192 ug/dL (ref 111–343)

## 2021-04-28 LAB — PSA: Prostate Specific Ag, Serum: 1.1 ng/mL (ref 0.0–4.0)

## 2021-04-28 LAB — TSH+T4F+T3FREE
Free T4: 1.53 ng/dL (ref 0.82–1.77)
T3, Free: 3 pg/mL (ref 2.0–4.4)
TSH: 1.2 u[IU]/mL (ref 0.450–4.500)

## 2021-04-28 LAB — PROLACTIN: Prolactin: 8.8 ng/mL (ref 4.0–15.2)

## 2021-05-01 NOTE — Progress Notes (Signed)
? ?05/02/21 ?9:12 AM  ? ?David Aguirre ?[birth year] ?[id number] ? ?Referring provider:  ?David Sprout, FNP ?[facility] ?[place],  [place] [address] ?Chief Complaint  ?Patient presents with  ? testicular atrophy  ? ? ? 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  ? ?He has a personal history of hypogonadism he was previously taking topical testosterone and was changed to Clomid he was on Clomid for about 2 months. He had continue lethargy and decreased libido on Clomid. He was taken off of clomid.  ? 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 Labs were ordered on 04/21/2021. He had testosterone of 210, creatinine of 1.33, PSA of 1.1, and TSH + T4F +T3Free were all within normal range. He was further referred to urology.  ? ?He reports that he has been on testosterone for about 15 years and he has been fine. He reports that he was on Clomid for about 2 months and he had increased lethargy. He reports that his libido never came back and he noticed testicular atrophy. He questions if he should be taking Clomid to block estrogen. He reports that when he came off Clomid his testosterone never came back into normal range. He is using 4 pumps of testosterone.  ? ?He has 2 children that are not biological. He is not interested in this time in having biological children.  ? ?He report that he has increased urinary frequency he attributes this to drinking a half gallon of water daily.  ? ?He reports that his PSA has remained stable and David Aguirre is monitoring a "tag" on his prostate.  ? ?PMH: ?Past Medical History:  ?Diagnosis Date  ? Low testosterone   ? ? ?Surgical History: ?Past Surgical History:  ?Procedure Laterality Date  ? CHOLECYSTECTOMY N/A 11/28/2014  ? Procedure: LAPAROSCOPIC CHOLECYSTECTOMY WITH INTRAOPERATIVE CHOLANGIOGRAM;  Surgeon: David Glen,  MD;  Location: [facility] ORS;  Service: General;  Laterality: N/A;  ? NASAL SEPTUM SURGERY    ? NASAL SINUS SURGERY    ? TONSILLECTOMY    ? TURBINATE REDUCTION Bilateral   ? ? ?Home Medications:  ?Allergies as of 05/02/2021   ?No Known Allergies ?  ? ?  ?Medication List  ?  ? ?  ? Accurate as of May 02, 2021  9:12 AM. If you have any questions, ask your nurse or doctor.  ?  ?  ? ?  ? ?STOP taking these medications   ? ?clomiPHENE 50 MG tablet ?Commonly known as: Clomid ?Stopped by: David Espy, MD ?  ?finasteride 1 MG tablet ?Commonly known as: PROPECIA ?Stopped by: David Espy, MD ?  ?hydrocortisone 25 MG suppository ?Commonly known as: ANUSOL-HC ?Stopped by: David Espy, MD ?  ?hydroxychloroquine 200 MG tablet ?Commonly known as: Plaquenil ?Stopped by: David Espy, MD ?  ? ?  ? ?TAKE these medications   ? ?albuterol 108 (90 Base) MCG/ACT inhaler ?Commonly known as: VENTOLIN HFA ?Inhale 2 puffs into the lungs every 4 (four) hours as needed for wheezing or shortness of breath. ?  ?FIBER PO ?Take by mouth. ?  ?finasteride 5 MG tablet ?Commonly known as: PROSCAR ?Take 5 mg by mouth daily. ?  ?MULTIPLE VITAMIN PO ?Take by mouth. ?  ?Testosterone 1.62 % Gel ?APPLY 4 PUMPS TOPICALLY TO THE AFFECTED AREA DAILY ?  ?triamcinolone cream 0.1 % ?Commonly known as: KENALOG ?Daily  at bedtime for 2 weeks ?  ? ?  ? ? ?Allergies: No Known Allergies ? ?Family History: ?Family History  ?Problem Relation Age of Onset  ? Diabetes Mother   ? Hyperlipidemia Mother   ? Hyperlipidemia Father   ? Prostate cancer Paternal Grandfather   ? ? ?Social History:  reports that he has quit smoking. He has quit using smokeless tobacco.  His smokeless tobacco use included snuff. He reports current alcohol use. He reports that he does not use drugs. ? ? ?Physical Exam: ?BP (!) 137/94   Pulse 69   Ht 6' (1.829 m)   Wt 260 lb (117.9 kg)   BMI 35.26 kg/m?   ?Constitutional:  Alert and oriented, No acute distress. ?HEENT: [place] AT, moist mucus  membranes.  Trachea midline, no masses. ?Cardiovascular: No clubbing, cyanosis, or edema. ?Respiratory: Normal respiratory effort, no increased work of breathing. ?GU: Normal phallus with bilateral decended testicles showing bilateral atrophy the size of a large grape  ?Skin: No rashes, bruises or suspicious lesions. ?Neurologic: Grossly intact, no focal deficits, moving all 4 extremities. ?Psychiatric: Normal mood and affect. ? ?Laboratory Data: ?Lab Results  ?Component Value Date  ? CREATININE 1.33 (H) 04/21/2021  ? ?Lab Results  ?Component Value Date  ? PSA 1.6 09/11/2012  ? ?Lab Results  ?Component Value Date  ? TESTOSTERONE 210 (L) 04/21/2021  ? ?Lab Results  ?Component Value Date  ? HGBA1C 5.5 10/01/2016  ? ? ?Assessment & Plan:   ?Bilateral testicular atrophy  ?- Exam showed large grape sized testicles with bilateral atrophy ?-Secondary to prolonged exogenous testosterone use, likely reflective of probable impaired spermatogenesis as well as ability to produce testosterone ?- We discussed that addition ofHCG into his regimen which potentially could be used weekly in conjunction with TRT to help reduce continued testicular atrophy. Discussed how this may not increase fertility. We discussed the biomechanics of this medication. He is not interested at this time.  ? ?Hypogonadism  ?- He is currently on testosterone gel ?- We discussed the impacts of testosterone such as impact on fertility. We discussed that the bodies ability to make testosterone on its on decreases the longer he is on testosterone. He is not interested at this time in having children.  ?- We discussed testosterone cypionate today he is interested in this which may allow him to achieve more appropriate physiologic levels than topical agents ?- Will have him follow-up Testosterone cypionate injection teaching with David Council, PA-C ?- Will have him undergo labs after 3 months of injections; H&H, Testosterone ? ?3. Decreased libido  ?-  Secondary to hypogonadism  ?- as above  ? ?Return for testosterone cypionate injection teaching with David Council, PA-C.  ? ?I,David Aguirre,acting as a scribe for David Espy, MD.,have documented all relevant documentation on the behalf of David Espy, MD,as directed by  David Espy, MD while in the presence of David Espy, MD. ? ?I have reviewed the above documentation for accuracy and completeness, and I agree with the above.  ? ?David Espy, MD ? ? ?[facility] ?[address], [address] ?[place], [place] [address] ?[phone] ?

## 2021-05-02 ENCOUNTER — Encounter: Payer: Self-pay | Admitting: Urology

## 2021-05-02 ENCOUNTER — Ambulatory Visit: Payer: BC Managed Care – PPO | Admitting: Urology

## 2021-05-02 ENCOUNTER — Other Ambulatory Visit: Payer: Self-pay

## 2021-05-02 VITALS — BP 137/94 | HR 69 | Ht 72.0 in | Wt 260.0 lb

## 2021-05-02 DIAGNOSIS — R7989 Other specified abnormal findings of blood chemistry: Secondary | ICD-10-CM | POA: Diagnosis not present

## 2021-05-02 LAB — URINALYSIS, COMPLETE
Bilirubin, UA: NEGATIVE
Glucose, UA: NEGATIVE
Ketones, UA: NEGATIVE
Leukocytes,UA: NEGATIVE
Nitrite, UA: NEGATIVE
Protein,UA: NEGATIVE
RBC, UA: NEGATIVE
Specific Gravity, UA: 1.01 (ref 1.005–1.030)
Urobilinogen, Ur: 0.2 mg/dL (ref 0.2–1.0)
pH, UA: 6 (ref 5.0–7.5)

## 2021-05-02 LAB — MICROSCOPIC EXAMINATION: Bacteria, UA: NONE SEEN

## 2021-05-02 MED ORDER — TESTOSTERONE CYPIONATE 200 MG/ML IM SOLN: 100.0000 mg | INTRAMUSCULAR | 0 refills | Status: DC

## 2021-05-12 ENCOUNTER — Telehealth: Payer: Self-pay | Admitting: *Deleted

## 2021-05-12 NOTE — Telephone Encounter (Signed)
Received PA from [facility] approved 05/05/21-05/04/22 ? ?

## 2021-05-22 NOTE — Progress Notes (Signed)
? ? ?05/24/21 ?CC:  ?Chief Complaint  ?Patient presents with  ? Other  ?  Injection teaching ?  ? ?Urological history: ?Bilateral testicular atrophy  ?- Exam on with Dr Erlene Quan showed large grape sized testicles with bilateral atrophy ?- Secondary to prolonged exogenous testosterone use likely reflective of probable impaired spermatogenesis as well as ability to produce testosterone ? ?2. Hypogonadism  ?- Previously managed on testosterone gel  ? ?HPI:  ?David Aguirre is a 46 y.o.  male with testosterone deficiency who presents today for instruction on delivering an IM injection of testosterone cypionate.   ? ?I instructed the patient to identify the concentration of his testosterone. Testosterone for injection is usually in the form of testosterone cypionate. These liquids come in multiple concentrations, so before giving an injection, it?s very important to make sure that his intended dosage takes into account the concentration of the testosterone serum. Usually, testosterone comes in a concentration of either 100 mg/ml or 200 mg/ml.  We typically use the 200 mg/mL in this office.   ? ?Using a sterile, 18 G needle and 3 cc syringe, the testosterone cypionate Lot #[id number] exp# 07/2022 was drawn up for a 0.5  cc injection.  To draw up the dose, I demonstrated how to first draw air into the syringe equal to the volume of the dosage. Then, wipe the top of the medication bottle with an alcohol wipe, insert the needle through the lid and into the medication, and push the air from your syringe into the bottle. Turn the bottle upside down and draw out the exact dosage of testosterone. ? ?I demonstrated how to aspirate the syringe by hinge the syringe with its needle uncapped and pointing up in front of him.  Looking for air bubbles in the syringe. Flick the side of the syringe to get these bubbles to rise to the top.   ? ?When the dosage is bubble-free, I slowly depressed the plunger to force the air at the top of  the syringe out stopping when a tiny drop of medication comes out of the tip of the syringe.  I advised him to be certain no air remained in the syringe as injecting air is very dangerous.  Being careful not to squirt or spray a significant portion of the dosage onto the floor. ? ?Preparing the injection site, outer middle third of the vastus lateralis muscle of the thigh, I took a sterile alcohol pad and wipe the immediate area around where I intended him to inject.  ? ?I then demonstrated how to change the needle from the 18 G to the 21 G needle. ? ?I then gave him the syringe for injecting.  He correctly identified the injection location. ? ?He held the syringe like a dart at a 90-degree angle above the right sterile injection site. Quickly plunged it into the flesh. Before depressing the plunger, he drew back on it slightly and no blood was seen.  I advised him that if blood flashed in the syringe, he would need to remove the needle and then select a different location as he was in the vein.  Inject the medication at a steady, controlled pace. ? ?He fully depressed the plunger, slowly pull the needle out. Pressing around the injection site with a sterile cotton swab as he did so - this preventing the emerging needle from pulling on the skin and causing extra pain.  We assessed the needle entry point for bleeding, and applied a sterile Band-Aid and/or  cotton swab if needed. Disposed of the used needle and syringe in a proper sharps container.  I advised him to acquire a sharps container for his personal use at home. ? ?I advised him that If, after injection, he experienced redness, swelling, or discomfort beyond that of normal soreness at the site of injection, call our office for an appointment and instructions. ? ?He is to always store his medication at the recommended temperature, and always check the expiration date on the bottle. If it?s expired, don't use it.  Of course, keep all of med's out of reach of  children.  Do not change his dose without consulting your provider. ? ?His starting dose will be 0.5 cc every week.  He will return 3 to 4 days after his fourth injection for a testosterone level ? ?Chandria Rookstool, PA-C ? ?  ?I,Kailey Littlejohn,acting as a scribe for [employer], PA-C.,have documented all relevant documentation on the behalf of [place], PA-C,as directed by  [facility], PA-C while in the presence of [place], PA-C. ? ?I have reviewed the above documentation for accuracy and completeness, and I agree with the above.   ? ?Zara Council, PA-C  ? ?I spent 15 minutes on the day of the encounter to include pre-visit record review, face-to-face time with the patient, and post-visit ordering of tests.  ?

## 2021-05-24 ENCOUNTER — Other Ambulatory Visit: Payer: Self-pay

## 2021-05-24 ENCOUNTER — Encounter: Payer: Self-pay | Admitting: Urology

## 2021-05-24 ENCOUNTER — Ambulatory Visit: Payer: BC Managed Care – PPO | Admitting: Urology

## 2021-05-24 VITALS — BP 147/93 | HR 76 | Ht 72.0 in | Wt 255.0 lb

## 2021-05-24 DIAGNOSIS — R7989 Other specified abnormal findings of blood chemistry: Secondary | ICD-10-CM

## 2021-05-24 MED ORDER — "BD DISP NEEDLES 18G X 1-1/2"" MISC": 1.0000 mg | 0 refills | Status: DC

## 2021-05-24 MED ORDER — "BD DISP NEEDLES 21G X 1-1/2"" MISC": 1.0000 mg | 0 refills | Status: DC

## 2021-05-24 MED ORDER — SYRINGE 2-3 ML 3 ML MISC: 1.0000 mg | 3 refills | Status: DC

## 2021-06-15 ENCOUNTER — Other Ambulatory Visit: Payer: BC Managed Care – PPO

## 2021-06-15 DIAGNOSIS — R7989 Other specified abnormal findings of blood chemistry: Secondary | ICD-10-CM | POA: Diagnosis not present

## 2021-06-15 DIAGNOSIS — E291 Testicular hypofunction: Secondary | ICD-10-CM | POA: Diagnosis not present

## 2021-06-16 LAB — HEMOGLOBIN AND HEMATOCRIT, BLOOD
Hematocrit: 48 % (ref 37.5–51.0)
Hemoglobin: 15.9 g/dL (ref 13.0–17.7)

## 2021-06-16 LAB — TESTOSTERONE: Testosterone: 792 ng/dL (ref 264–916)

## 2021-06-19 ENCOUNTER — Ambulatory Visit
Admission: EM | Admit: 2021-06-19 | Discharge: 2021-06-19 | Disposition: A | Discharge status: Home / Self Care | Payer: BC Managed Care – PPO | Attending: Physician Assistant | Admitting: Physician Assistant

## 2021-06-19 DIAGNOSIS — J209 Acute bronchitis, unspecified: Secondary | ICD-10-CM | POA: Diagnosis not present

## 2021-06-19 DIAGNOSIS — R051 Acute cough: Secondary | ICD-10-CM

## 2021-06-19 MED ORDER — CHERATUSSIN AC 100-10 MG/5ML PO SOLN: 10.0000 mL | Freq: Three times a day (TID) | ORAL | 0 refills | Status: DC | PRN

## 2021-06-19 MED ORDER — AZITHROMYCIN 250 MG PO TABS: 250.0000 mg | ORAL_TABLET | Freq: Every day | ORAL | 0 refills | Status: DC

## 2021-06-19 MED ORDER — PREDNISONE 20 MG PO TABS: 40.0000 mg | ORAL_TABLET | Freq: Every day | ORAL | 0 refills | Status: AC

## 2021-06-19 NOTE — ED Provider Notes (Signed)
?[facility] ? ? ? ?CSN: [id number] ?Arrival date & time: 06/19/21  1534 ? ? ?  ? ?History   ?Chief Complaint ?Chief Complaint  ?Patient presents with  ? Cough  ? Nasal Congestion  ? ? ?HPI ?David Aguirre is a 46 y.o. male presenting for approximately 1 month history of cough and congestion.  Patient says he has a little bit of nasal congestion but everything that comes out is clear.  He says that his cough is nonproductive but he feels like he needs to cough something up.  Reports getting into a lot of coughing fits.  Cough disturbs his sleep.  Reports symptoms seem to be getting worse and not better.  No associated sinus pain, sore throat, breathing difficulty.  Patient says sometimes when he lays down he feels like he is wheezing.  Reports history of allergies and says he takes Claritin every day.  Has also been taking Mucinex daily but says it is not helping.  Patient says a lot of people are sick at work passing colds around.  No report of any COVID exposures.  Patient denies any history of asthma, COPD or respiratory conditions.  No other complaints. ? ?HPI ? ?Past Medical History:  ?Diagnosis Date  ? Low testosterone   ? ? ?Patient Active Problem List  ? Diagnosis Date Noted  ? Low testosterone 04/20/2021  ? 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 Acid reflux 09/15/2014  ? 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 History of tobacco use 09/15/2014  ? Adiposity 09/15/2014  ? ? ?Past Surgical History:  ?Procedure Laterality Date  ? CHOLECYSTECTOMY N/A 11/28/2014  ? Procedure: LAPAROSCOPIC CHOLECYSTECTOMY WITH INTRAOPERATIVE CHOLANGIOGRAM;  Surgeon: Florene Glen, MD;  Location: [facility] ORS;  Service: General;  Laterality: N/A;  ? NASAL SEPTUM SURGERY    ? NASAL SINUS SURGERY    ? TONSILLECTOMY    ? TURBINATE REDUCTION Bilateral   ? ? ? ? ? ?Home Medications   ? ?Prior to Admission medications   ?Medication Sig Start Date End Date Taking?  Authorizing Provider  ?albuterol (VENTOLIN HFA) 108 (90 Base) MCG/ACT inhaler Inhale 2 puffs into the lungs every 4 (four) hours as needed for wheezing or shortness of breath. 08/12/19  Yes Rodriguez-Southworth, Sunday Spillers, PA-C  ?azithromycin (ZITHROMAX) 250 MG tablet Take 1 tablet (250 mg total) by mouth daily. Take first 2 tablets together, then 1 every day until finished. 06/19/21  Yes Danton Clap, PA-C  ?FIBER PO Take by mouth.   Yes [provider]  ?finasteride (PROSCAR) 5 MG tablet Take 5 mg by mouth daily. 03/19/21  Yes [provider]  ?guaiFENesin-codeine (CHERATUSSIN AC) 100-10 MG/5ML syrup Take 10 mLs by mouth 3 (three) times daily as needed for cough. 06/19/21  Yes Laurene Footman B, PA-C  ?MULTIPLE VITAMIN PO Take by mouth.   Yes [provider]  ?NEEDLE, DISP, 18 G (BD DISP NEEDLES) 18G X 1-1/2" MISC 1 mg by Does not apply route every 14 (fourteen) days. 05/24/21  Yes McGowan, Hunt Oris, PA-C  ?NEEDLE, DISP, 21 G (BD DISP NEEDLES) 21G X 1-1/2" MISC 1 mg by Does not apply route every 14 (fourteen) days. 05/24/21  Yes McGowan, Larene Beach A, PA-C  ?predniSONE (DELTASONE) 20 MG tablet Take 2 tablets (40 mg total) by mouth daily for 5 days. 06/19/21 06/24/21 Yes Danton Clap, PA-C  ?Syringe, Disposable, (2-3CC SYRINGE) 3 ML MISC 1 mg by Does not apply route every  14 (fourteen) days. 05/24/21  Yes McGowan, Larene Beach A, PA-C  ?testosterone cypionate (DEPOTESTOSTERONE CYPIONATE) 200 MG/ML injection Inject 0.5 mLs (100 mg total) into the muscle every 7 (seven) days. 05/02/21  Yes Hollice Espy, MD  ?triamcinolone cream (KENALOG) 0.1 % Daily at bedtime for 2 weeks 03/24/19  Yes Jerrol Banana., MD  ? ? ?Family History ?Family History  ?Problem Relation Age of Onset  ? Diabetes Mother   ? Hyperlipidemia Mother   ? Hyperlipidemia Father   ? Prostate cancer Paternal Grandfather   ? ? ?Social History ?Social History  ? ?Tobacco Use  ? Smoking status: Former  ? Smokeless tobacco: Current  ?   Types: Snuff  ?Vaping Use  ? Vaping Use: Never used  ?Substance Use Topics  ? Alcohol use: Yes  ?  Comment: 2-3 times a week  ? Drug use: No  ? ? ? ?Allergies   ?Patient has no known allergies. ? ? ?Review of Systems ?Review of Systems  ?Constitutional:  Positive for fatigue. Negative for fever.  ?HENT:  Positive for congestion. Negative for rhinorrhea, sinus pressure, sinus pain and sore throat.   ?Respiratory:  Positive for cough and wheezing. Negative for shortness of breath.   ?Cardiovascular:  Positive for chest pain (due to cough).  ?Gastrointestinal:  Negative for abdominal pain, diarrhea, nausea and vomiting.  ?Musculoskeletal:  Negative for myalgias.  ?Neurological:  Negative for weakness, light-headedness and headaches.  ?Hematological:  Negative for adenopathy.  ? ? ?Physical Exam ?Triage Vital Signs ?ED Triage Vitals  ?Enc Vitals Group  ?   BP   ?   Pulse   ?   Resp   ?   Temp   ?   Temp src   ?   SpO2   ?   Weight   ?   Height   ?   Head Circumference   ?   Peak Flow   ?   Pain Score   ?   Pain Loc   ?   Pain Edu?   ?   Excl. in [place]?   ? ?No data found. ? ?Updated Vital Signs ?BP (!) 138/93 (BP Location: Left Arm)   Pulse 73   Temp 98.9 ?F (37.2 ?C) (Oral)   Resp 18   Ht 6' (1.829 m)   Wt 250 lb (113.4 kg)   SpO2 98%   BMI 33.91 kg/m?  ?   ? ?Physical Exam ?Vitals and nursing note reviewed.  ?Constitutional:   ?   General: He is not in acute distress. ?   Appearance: Normal appearance. He is well-developed. He is ill-appearing (mildly, appears fatigued).  ?HENT:  ?   Head: Normocephalic and atraumatic.  ?   Nose: Congestion present.  ?   Mouth/Throat:  ?   Mouth: Mucous membranes are moist.  ?   Pharynx: Oropharynx is clear.  ?Eyes:  ?   General: No scleral icterus. ?   Conjunctiva/sclera: Conjunctivae normal.  ?Cardiovascular:  ?   Rate and Rhythm: Normal rate and regular rhythm.  ?   Heart sounds: Normal heart sounds.  ?Pulmonary:  ?   Effort: Pulmonary effort is normal. No respiratory distress.   ?   Breath sounds: Rhonchi (bilateral upper lung fields) present. No wheezing or rales.  ?Musculoskeletal:  ?   Cervical back: Neck supple.  ?Skin: ?   General: Skin is warm and dry.  ?   Capillary Refill: Capillary refill takes less than 2 seconds.  ?Neurological:  ?  General: No focal deficit present.  ?   Mental Status: He is alert. Mental status is at baseline.  ?   Motor: No weakness.  ?   Coordination: Coordination normal.  ?   Gait: Gait normal.  ?Psychiatric:     ?   Mood and Affect: Mood normal.     ?   Behavior: Behavior normal.     ?   Thought Content: Thought content normal.  ? ? ? ?UC Treatments / Results  ?Labs ?(all labs ordered are listed, but only abnormal results are displayed) ?Labs Reviewed - No data to display ? ?EKG ? ? ?Radiology ?No results found. ? ?Procedures ?Procedures (including critical care time) ? ?Medications Ordered in UC ?Medications - No data to display ? ?Initial Impression / Assessment and Plan / UC Course  ?I have reviewed the triage vital signs and the nursing notes. ? ?Pertinent labs & imaging results that were available during my care of the patient were reviewed by me and considered in my medical decision making (see chart for details). ? ?46 year old male presenting for 1 month history of congestion and mostly nonproductive cough.  Also chest pain with coughing and occasional wheezing.  No fever or shortness of breath.  Vitals are stable.  Patient mildly ill-appearing but nontoxic.  Nasal congestion on exam as well as rhonchi of bilateral upper airways.  No respiratory distress.  Discussed getting a chest x-ray the patient's oxygen is normal, he is afebrile not complaining of any shortness of breath.  Suspect likely bronchitis.  Advised him most of the time this was due to a virus but he has been sick for an entire month and is feeling worse so we will try antibiotic at this time.  I believe that is reasonable.  Sent azithromycin.  Also sent prednisone and Cheratussin  after reviewing controlled substance database.  Advised him to return for fever or worsening symptoms. ? ? ?Final Clinical Impressions(s) / UC Diagnoses  ? ?Final diagnoses:  ?Acute bronchitis, unspecified organism  ?A

## 2021-06-19 NOTE — ED Triage Notes (Signed)
Pt c/o cough, congestion x50month?

## 2021-06-19 NOTE — Discharge Instructions (Signed)
-  Your symptoms are consistent with acute bronchitis.  As we discussed, most of the time this is due to a virus but since you have been sick for a whole month and not improving, we can try an antibiotic.  I sent azithromycin to pharmacy as well as prednisone and cough medication.  Increase rest and fluids.  You should be improving over the next 1 week but if you develop fever, worsening cough, chest pain that is worsening or have breathing difficulty, you should be reevaluated. ?

## 2021-06-20 ENCOUNTER — Telehealth: Payer: Self-pay

## 2021-06-20 NOTE — Telephone Encounter (Signed)
Notified pt as advised, pt expressed understanding. Pt states he injects on Sunday's, lab appt booked along with office visit.  ?

## 2021-06-20 NOTE — Telephone Encounter (Signed)
-----   Message from Nori Riis, PA-C sent at 06/20/2021  8:01 AM EDT ----- ?Please let David Aguirre know that his blood work is normal and to continue his present dose of 0.5 cc every 7 days of his testosterone cypionate.  We will need to see him in 3 months for IPSS, SH IM and exam with a PSA, testosterone (drawn 3 to 4 days after an injection) and H&H ?

## 2021-06-26 ENCOUNTER — Ambulatory Visit (INDEPENDENT_AMBULATORY_CARE_PROVIDER_SITE_OTHER): Payer: BC Managed Care – PPO

## 2021-06-26 ENCOUNTER — Ambulatory Visit
Admission: EM | Admit: 2021-06-26 | Discharge: 2021-06-26 | Disposition: A | Discharge status: Home / Self Care | Payer: BC Managed Care – PPO | Attending: Internal Medicine | Admitting: Internal Medicine

## 2021-06-26 DIAGNOSIS — R059 Cough, unspecified: Secondary | ICD-10-CM | POA: Diagnosis not present

## 2021-06-26 DIAGNOSIS — R058 Other specified cough: Secondary | ICD-10-CM

## 2021-06-26 MED ORDER — GUAIFENESIN ER 600 MG PO TB12: 600.0000 mg | ORAL_TABLET | Freq: Two times a day (BID) | ORAL | 0 refills | Status: AC

## 2021-06-26 MED ORDER — BENZONATATE 200 MG PO CAPS: 200.0000 mg | ORAL_CAPSULE | Freq: Three times a day (TID) | ORAL | 1 refills | Status: DC | PRN

## 2021-06-26 NOTE — ED Triage Notes (Signed)
Pt states he is following up from 04/24. He states continued cough and congestion. He states prescribed medications helped for a day.  ? ?Denies fever.  ?

## 2021-06-26 NOTE — Discharge Instructions (Addendum)
Your chest x-ray is negative for pneumonia ?Please continue to use your inhaler and take prescribed cough medication ?The cough will resolve in the next 7 to 10 days ?Return to urgent care if you have any other concerns. ? ?

## 2021-06-26 NOTE — ED Provider Notes (Signed)
?[facility] ? ? ? ?CSN: [id number] ?Arrival date & time: 06/26/21  1507 ? ? ?  ? ?History   ?Chief Complaint ?Chief Complaint  ?Patient presents with  ? Cough  ?  Congestion   ? ? ?HPI ?David Aguirre is a 46 y.o. male comes to the urgent care with persistent cough over the past few weeks.  Patient was seen a week ago and prescribed a course of antibiotics and steroids.  Patient has completed the regimen but continues to have a cough which is nonproductive.  He denies any fever, chest tightness or generalized body aches.  He endorses cough which is minimally productive and not associated with any shortness of breath.  He has some wheezing.  No rashes noted.  No vomiting or diarrhea.  No nausea.  No chest pain or chest tightness.  ? ?HPI ? ?Past Medical History:  ?Diagnosis Date  ? Low testosterone   ? ? ?Patient Active Problem List  ? Diagnosis Date Noted  ? Low testosterone 04/20/2021  ? 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 Acid reflux 09/15/2014  ? 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 History of tobacco use 09/15/2014  ? Adiposity 09/15/2014  ? ? ?Past Surgical History:  ?Procedure Laterality Date  ? CHOLECYSTECTOMY N/A 11/28/2014  ? Procedure: LAPAROSCOPIC CHOLECYSTECTOMY WITH INTRAOPERATIVE CHOLANGIOGRAM;  Surgeon: Florene Glen, MD;  Location: [facility] ORS;  Service: General;  Laterality: N/A;  ? NASAL SEPTUM SURGERY    ? NASAL SINUS SURGERY    ? TONSILLECTOMY    ? TURBINATE REDUCTION Bilateral   ? ? ? ? ? ?Home Medications   ? ?Prior to Admission medications   ?Medication Sig Start Date End Date Taking? Authorizing Provider  ?benzonatate (TESSALON) 200 MG capsule Take 1 capsule (200 mg total) by mouth 3 (three) times daily as needed for cough. 06/26/21  Yes Becky Berberian, Myrene Galas, MD  ?guaiFENesin (MUCINEX) 600 MG 12 hr tablet Take 1 tablet (600 mg total) by mouth 2 (two) times daily for 10 days. 06/26/21 07/06/21 Yes Cheyenna Pankowski, Myrene Galas, MD   ?albuterol (VENTOLIN HFA) 108 (90 Base) MCG/ACT inhaler Inhale 2 puffs into the lungs every 4 (four) hours as needed for wheezing or shortness of breath. 08/12/19   Rodriguez-Southworth, Sunday Spillers, PA-C  ?azithromycin (ZITHROMAX) 250 MG tablet Take 1 tablet (250 mg total) by mouth daily. Take first 2 tablets together, then 1 every day until finished. 06/19/21   Danton Clap, PA-C  ?FIBER PO Take by mouth.    [provider]  ?finasteride (PROSCAR) 5 MG tablet Take 5 mg by mouth daily. 03/19/21   [provider]  ?guaiFENesin-codeine (CHERATUSSIN AC) 100-10 MG/5ML syrup Take 10 mLs by mouth 3 (three) times daily as needed for cough. 06/19/21   Laurene Footman B, PA-C  ?MULTIPLE VITAMIN PO Take by mouth.    [provider]  ?NEEDLE, DISP, 18 G (BD DISP NEEDLES) 18G X 1-1/2" MISC 1 mg by Does not apply route every 14 (fourteen) days. 05/24/21   Nori Riis, PA-C  ?NEEDLE, DISP, 21 G (BD DISP NEEDLES) 21G X 1-1/2" MISC 1 mg by Does not apply route every 14 (fourteen) days. 05/24/21   Nori Riis, PA-C  ?Syringe, Disposable, (2-3CC SYRINGE) 3 ML MISC 1 mg by Does not apply route every 14 (fourteen) days. 05/24/21   Zara Council A, PA-C  ?testosterone cypionate (DEPOTESTOSTERONE CYPIONATE) 200 MG/ML injection Inject 0.5 mLs (100 mg total) into  the muscle every 7 (seven) days. 05/02/21   Hollice Espy, MD  ?triamcinolone cream (KENALOG) 0.1 % Daily at bedtime for 2 weeks 03/24/19   Jerrol Banana., MD  ? ? ?Family History ?Family History  ?Problem Relation Age of Onset  ? Diabetes Mother   ? Hyperlipidemia Mother   ? Hyperlipidemia Father   ? Prostate cancer Paternal Grandfather   ? ? ?Social History ?Social History  ? ?Tobacco Use  ? Smoking status: Former  ? Smokeless tobacco: Current  ?  Types: Snuff  ?Vaping Use  ? Vaping Use: Never used  ?Substance Use Topics  ? Alcohol use: Yes  ?  Comment: 2-3 times a week  ? Drug use: No  ? ? ? ?Allergies   ?Patient has no known  allergies. ? ? ?Review of Systems ?Review of Systems  ?Constitutional: Negative.   ?Respiratory:  Positive for cough and wheezing. Negative for shortness of breath.   ?Cardiovascular: Negative.   ?Gastrointestinal: Negative.   ?Genitourinary: Negative.   ?Neurological: Negative.   ? ? ?Physical Exam ?Triage Vital Signs ?ED Triage Vitals  ?Enc Vitals Group  ?   BP 06/26/21 1528 (!) 137/93  ?   Pulse Rate 06/26/21 1528 71  ?   Resp 06/26/21 1528 18  ?   Temp 06/26/21 1528 98.1 ?F (36.7 ?C)  ?   Temp Source 06/26/21 1528 Oral  ?   SpO2 06/26/21 1528 98 %  ?   Weight --   ?   Height --   ?   Head Circumference --   ?   Peak Flow --   ?   Pain Score 06/26/21 1526 0  ?   Pain Loc --   ?   Pain Edu? --   ?   Excl. in [place]? --   ? ?No data found. ? ?Updated Vital Signs ?BP (!) 137/93 (BP Location: Left Arm)   Pulse 71   Temp 98.1 ?F (36.7 ?C) (Oral)   Resp 18   SpO2 98%  ? ?Visual Acuity ?Right Eye Distance:   ?Left Eye Distance:   ?Bilateral Distance:   ? ?Right Eye Near:   ?Left Eye Near:    ?Bilateral Near:    ? ?Physical Exam ?Vitals and nursing note reviewed.  ?Constitutional:   ?   General: He is not in acute distress. ?   Appearance: He is not ill-appearing.  ?HENT:  ?   Right Ear: Tympanic membrane normal.  ?   Left Ear: Tympanic membrane normal.  ?Cardiovascular:  ?   Rate and Rhythm: Normal rate and regular rhythm.  ?   Pulses: Normal pulses.  ?   Heart sounds: Normal heart sounds.  ?Pulmonary:  ?   Effort: Pulmonary effort is normal.  ?   Breath sounds: Normal breath sounds.  ?Abdominal:  ?   General: Abdomen is flat.  ?   Palpations: Abdomen is soft.  ?Neurological:  ?   Mental Status: He is alert.  ? ? ? ?UC Treatments / Results  ?Labs ?(all labs ordered are listed, but only abnormal results are displayed) ?Labs Reviewed - No data to display ? ?EKG ? ? ?Radiology ?DG Chest 2 View ? ?Result Date: 06/26/2021 ?CLINICAL DATA:  persistent cough 3 weeks EXAM: CHEST - 2 VIEW COMPARISON:  Chest x-ray December 27, 2008.  FINDINGS: No consolidation. No visible pleural effusions or pneumothorax. Cardiomediastinal silhouette is within normal limits. No displaced fracture. IMPRESSION: No evidence of acute cardiopulmonary disease. Electronically  Signed   By: Margaretha Sheffield M.D.   On: 06/26/2021 16:29   ? ?Procedures ?Procedures (including critical care time) ? ?Medications Ordered in UC ?Medications - No data to display ? ?Initial Impression / Assessment and Plan / UC Course  ?I have reviewed the triage vital signs and the nursing notes. ? ?Pertinent labs & imaging results that were available during my care of the patient were reviewed by me and considered in my medical decision making (see chart for details). ? ?  ? ?1.  Postviral cough syndrome: ?Chest x-ray is negative for acute lung infiltrate ?Patient has completed a course of steroids and antibiotics ?We will manage this symptomatically ?Continue albuterol inhaler use ?Tessalon Perles as needed for cough ?Return to urgent care if you have any other concerns. ?Final Clinical Impressions(s) / UC Diagnoses  ? ?Final diagnoses:  ?Post-viral cough syndrome  ? ? ? ?Discharge Instructions   ? ?  ?Your chest x-ray is negative for pneumonia ?Please continue to use your inhaler and take prescribed cough medication ?The cough will resolve in the next 7 to 10 days ?Return to urgent care if you have any other concerns. ? ? ? ? ? ?ED Prescriptions   ? ? Medication Sig Dispense Auth. Provider  ? benzonatate (TESSALON) 200 MG capsule Take 1 capsule (200 mg total) by mouth 3 (three) times daily as needed for cough. 30 capsule [place] Perra, Myrene Galas, MD  ? guaiFENesin (MUCINEX) 600 MG 12 hr tablet Take 1 tablet (600 mg total) by mouth 2 (two) times daily for 10 days. 20 tablet Danahi Reddish, Myrene Galas, MD  ? ?  ? ?PDMP not reviewed this encounter. ?  ?Chase Picket, MD ?06/26/21 1647 ? ?

## 2021-07-28 ENCOUNTER — Other Ambulatory Visit: Payer: Self-pay | Admitting: Family Medicine

## 2021-07-28 DIAGNOSIS — R7989 Other specified abnormal findings of blood chemistry: Secondary | ICD-10-CM

## 2021-07-28 MED ORDER — SYRINGE 2-3 ML 3 ML MISC: 1.0000 mg | 3 refills | Status: DC

## 2021-07-28 MED ORDER — "BD DISP NEEDLES 21G X 1-1/2"" MISC": 1.0000 mg | 3 refills | Status: DC

## 2021-08-15 ENCOUNTER — Other Ambulatory Visit: Payer: BC Managed Care – PPO

## 2021-08-16 NOTE — Progress Notes (Unsigned)
Complete physical exam   [name]   DOB: 04/29/1975   46 y.o. Male  MRN: [id number] Visit Date: 08/17/2021  Today's healthcare provider: Wilhemena Durie, MD   No chief complaint on file.  Subjective    David Aguirre is a 46 y.o. male who presents today for a complete physical exam.  He reports consuming a {diet types:17450} diet. {Exercise:19826} He generally feels {well/fairly well/poorly:18703}. He reports sleeping {well/fairly well/poorly:18703}. He {does/does not:200015} have additional problems to discuss today.  HPI  PSA-followed by Urology  Past Medical History:  Diagnosis Date   Low testosterone    Past Surgical History:  Procedure Laterality Date   CHOLECYSTECTOMY N/A 11/28/2014   Procedure: LAPAROSCOPIC CHOLECYSTECTOMY WITH INTRAOPERATIVE CHOLANGIOGRAM;  Surgeon: Florene Glen, MD;  Location: [facility] ORS;  Service: General;  Laterality: N/A;   NASAL SEPTUM SURGERY     NASAL SINUS SURGERY     TONSILLECTOMY     TURBINATE REDUCTION Bilateral    Social History   Socioeconomic History   Marital status: Married    Spouse name: Not on file   Number of children: Not on file   Years of education: Not on file   Highest education level: Not on file  Occupational History   Not on file  Tobacco Use   Smoking status: Former   Smokeless tobacco: Current    Types: Snuff  Vaping Use   Vaping Use: Never used  Substance and Sexual Activity   Alcohol use: Yes    Comment: 2-3 times a week   Drug use: No   Sexual activity: Not on file  Other Topics Concern   Not on file  Social History Narrative   Not on file   Social Determinants of Health   Financial Resource Strain: Not on file  Food Insecurity: Not on file  Transportation Needs: Not on file  Physical Activity: Not on file  Stress: Not on file  Social Connections: Not on file  Intimate Partner Violence: Not on file   Family Status  Relation Name Status   Mother  Alive   Father   Alive   Sister  Alive   Brother  Alive   PGF  (Not Specified)   Family History  Problem Relation Age of Onset   Diabetes Mother    Hyperlipidemia Mother    Hyperlipidemia Father    Prostate cancer Paternal Grandfather    No Known Allergies  Patient Care Team: Jerrol Banana., MD as PCP - General (Family Medicine)   Medications: Outpatient Medications Prior to Visit  Medication Sig   albuterol (VENTOLIN HFA) 108 (90 Base) MCG/ACT inhaler Inhale 2 puffs into the lungs every 4 (four) hours as needed for wheezing or shortness of breath.   azithromycin (ZITHROMAX) 250 MG tablet Take 1 tablet (250 mg total) by mouth daily. Take first 2 tablets together, then 1 every day until finished.   benzonatate (TESSALON) 200 MG capsule Take 1 capsule (200 mg total) by mouth 3 (three) times daily as needed for cough.   FIBER PO Take by mouth.   finasteride (PROSCAR) 5 MG tablet Take 5 mg by mouth daily.   guaiFENesin-codeine (CHERATUSSIN AC) 100-10 MG/5ML syrup Take 10 mLs by mouth 3 (three) times daily as needed for cough.   MULTIPLE VITAMIN PO Take by mouth.   NEEDLE, DISP, 18 G (BD DISP NEEDLES) 18G X 1-1/2" MISC 1 mg by Does not apply route every 14 (fourteen) days.  NEEDLE, DISP, 21 G (BD DISP NEEDLES) 21G X 1-1/2" MISC 1 mg by Does not apply route every 14 (fourteen) days.   Syringe, Disposable, (2-3CC SYRINGE) 3 ML MISC 1 mg by Does not apply route every 14 (fourteen) days.   testosterone cypionate (DEPOTESTOSTERONE CYPIONATE) 200 MG/ML injection Inject 0.5 mLs (100 mg total) into the muscle every 7 (seven) days.   triamcinolone cream (KENALOG) 0.1 % Daily at bedtime for 2 weeks   No facility-administered medications prior to visit.    Review of Systems  {Labs  Heme  Chem  Endocrine  Serology  Results Review (optional):23779}  Objective     There were no vitals taken for this visit. {Show previous vital signs (optional):23777}   Physical Exam  ***  Last depression  screening scores    04/20/2021    2:42 PM 04/07/2020   10:04 AM 09/28/2019    8:32 AM  PHQ 2/9 Scores  PHQ - 2 Score 0 0 0  PHQ- 9 Score 0 0 4   Last fall risk screening    04/20/2021    2:42 PM  Fall Risk   Falls in the past year? 0   Last Audit-C alcohol use screening    04/20/2021    2:40 PM  Alcohol Use Disorder Test (AUDIT)  1. How often do you have a drink containing alcohol? 3  2. How many drinks containing alcohol do you have on a typical day when you are drinking? 0  3. How often do you have six or more drinks on one occasion? 1  AUDIT-C Score 4   A score of 3 or more in women, and 4 or more in men indicates increased risk for alcohol abuse, EXCEPT if all of the points are from question 1   No results found for any visits on 08/17/21.  Assessment & Plan    Routine Health Maintenance and Physical Exam  Exercise Activities and Dietary recommendations  Goals   None     Immunization History  Administered Date(s) Administered   Influenza Inj Mdck Quad With Preservative 12/11/2017   PFIZER(Purple Top)SARS-COV-2 Vaccination 11/19/2019, 12/14/2019    Health Maintenance  Topic Date Due   TETANUS/TDAP  Never done   COVID-19 Vaccine (3 - Pfizer series) 02/08/2020   COLONOSCOPY (Pts 45-75yr Insurance coverage will need to be confirmed)  Never done   INFLUENZA VACCINE  09/26/2021   Hepatitis C Screening  Completed   HIV Screening  Completed   HPV VACCINES  Aged Out    Discussed health benefits of physical activity, and encouraged him to engage in regular exercise appropriate for his age and condition.  ***  No follow-ups on file.     {provider attestation***:1}   RWilhemena Durie MD  [phone](phone) [phone](fax)  [place]

## 2021-08-17 ENCOUNTER — Ambulatory Visit (INDEPENDENT_AMBULATORY_CARE_PROVIDER_SITE_OTHER): Payer: BC Managed Care – PPO | Admitting: Family Medicine

## 2021-08-17 ENCOUNTER — Encounter: Payer: Self-pay | Admitting: Family Medicine

## 2021-08-17 VITALS — BP 130/68 | HR 73 | Resp 14 | Ht 72.0 in | Wt 268.0 lb

## 2021-08-17 DIAGNOSIS — E7800 Pure hypercholesterolemia, unspecified: Secondary | ICD-10-CM | POA: Diagnosis not present

## 2021-08-17 DIAGNOSIS — E6609 Other obesity due to excess calories: Secondary | ICD-10-CM

## 2021-08-17 DIAGNOSIS — Z6837 Body mass index (BMI) 37.0-37.9, adult: Secondary | ICD-10-CM

## 2021-08-17 DIAGNOSIS — Z0000 Encounter for general adult medical examination without abnormal findings: Secondary | ICD-10-CM | POA: Diagnosis not present

## 2021-08-17 DIAGNOSIS — Z23 Encounter for immunization: Secondary | ICD-10-CM | POA: Diagnosis not present

## 2021-08-17 DIAGNOSIS — R7303 Prediabetes: Secondary | ICD-10-CM

## 2021-08-17 DIAGNOSIS — Z1211 Encounter for screening for malignant neoplasm of colon: Secondary | ICD-10-CM | POA: Diagnosis not present

## 2021-08-17 MED ORDER — OZEMPIC (0.25 OR 0.5 MG/DOSE) 2 MG/1.5ML ~~LOC~~ SOPN: 0.2500 mg | PEN_INJECTOR | SUBCUTANEOUS | 5 refills | Status: DC

## 2021-08-18 ENCOUNTER — Other Ambulatory Visit: Payer: Self-pay

## 2021-08-18 ENCOUNTER — Telehealth: Payer: Self-pay | Admitting: Family Medicine

## 2021-08-18 DIAGNOSIS — Z1211 Encounter for screening for malignant neoplasm of colon: Secondary | ICD-10-CM

## 2021-08-18 MED ORDER — SUTAB 1479-225-188 MG PO TABS: 12.0000 | ORAL_TABLET | Freq: Once | ORAL | 0 refills | Status: AC

## 2021-08-18 MED ORDER — NA SULFATE-K SULFATE-MG SULF 17.5-3.13-1.6 GM/177ML PO SOLN: 1.0000 | Freq: Once | ORAL | 0 refills | Status: AC

## 2021-08-22 ENCOUNTER — Other Ambulatory Visit: Payer: Self-pay | Admitting: Family Medicine

## 2021-08-22 MED ORDER — PHENTERMINE HCL 37.5 MG PO CAPS: 37.5000 mg | ORAL_CAPSULE | ORAL | 3 refills | Status: AC

## 2021-08-31 ENCOUNTER — Other Ambulatory Visit: Payer: Self-pay

## 2021-08-31 DIAGNOSIS — R7989 Other specified abnormal findings of blood chemistry: Secondary | ICD-10-CM

## 2021-09-12 ENCOUNTER — Other Ambulatory Visit: Payer: Self-pay | Admitting: Urology

## 2021-09-12 NOTE — Telephone Encounter (Signed)
PT CALLED. HE IS OUT OF HIS MEDICATION : TESTOSTERONE CYPIONATE.  THANK YOU.

## 2021-09-14 ENCOUNTER — Other Ambulatory Visit: Payer: Self-pay | Admitting: *Deleted

## 2021-09-14 ENCOUNTER — Other Ambulatory Visit: Payer: BC Managed Care – PPO

## 2021-09-14 DIAGNOSIS — R7989 Other specified abnormal findings of blood chemistry: Secondary | ICD-10-CM | POA: Diagnosis not present

## 2021-09-14 NOTE — Telephone Encounter (Signed)
Left VM for patient to return my call

## 2021-09-15 LAB — HEMOGLOBIN AND HEMATOCRIT, BLOOD
Hematocrit: 51 % (ref 37.5–51.0)
Hemoglobin: 17.1 g/dL (ref 13.0–17.7)

## 2021-09-15 LAB — PSA: Prostate Specific Ag, Serum: 1.5 ng/mL (ref 0.0–4.0)

## 2021-09-15 LAB — TESTOSTERONE: Testosterone: 703 ng/dL (ref 264–916)

## 2021-09-18 NOTE — Progress Notes (Unsigned)
09/19/2021 9:24 AM   David Aguirre 12-31-1975 [id number]  Referring provider: Jerrol Banana., MD [address] [place],  [place] [id number]  Urological history: 1.  Testosterone deficiency -Contributing factors of age and obesity -Testosterone level, 08/2021 - 703 -HBG/HCT, 08/2021 - 17.1/51.0 -Testosterone cypionate 200 mg/mL, 0.5 cc every 7 days  No chief complaint on file.   HPI: David Aguirre is a 46 y.o. male who presents today for three month follow up.  His PSA is stable at 1.5.       PMH: Past Medical History:  Diagnosis Date   Low testosterone     Surgical History: Past Surgical History:  Procedure Laterality Date   CHOLECYSTECTOMY N/A 11/28/2014   Procedure: LAPAROSCOPIC CHOLECYSTECTOMY WITH INTRAOPERATIVE CHOLANGIOGRAM;  Surgeon: Florene Glen, MD;  Location: [facility] ORS;  Service: General;  Laterality: N/A;   NASAL SEPTUM SURGERY     NASAL SINUS SURGERY     TONSILLECTOMY     TURBINATE REDUCTION Bilateral     Home Medications:  Allergies as of 09/19/2021   No Known Allergies      Medication List        Accurate as of September 18, 2021  9:24 AM. If you have any questions, ask your nurse or doctor.          2-3CC SYRINGE 3 ML Misc 1 mg by Does not apply route every 14 (fourteen) days.   albuterol 108 (90 Base) MCG/ACT inhaler Commonly known as: VENTOLIN HFA Inhale 2 puffs into the lungs every 4 (four) hours as needed for wheezing or shortness of breath.   BD Disp Needles 18G X 1-1/2" Misc Generic drug: NEEDLE (DISP) 18 G 1 mg by Does not apply route every 14 (fourteen) days.   BD Disp Needles 21G X 1-1/2" Misc Generic drug: NEEDLE (DISP) 21 G 1 mg by Does not apply route every 14 (fourteen) days.   FIBER PO Take by mouth.   finasteride 5 MG tablet Commonly known as: PROSCAR Take 5 mg by mouth daily.   MULTIPLE VITAMIN PO Take by mouth.   Ozempic (0.25 or 0.5 MG/DOSE) 2 MG/1.5ML Sopn Generic drug:  Semaglutide(0.25 or 0.'5MG'$ /DOS) Inject 0.25 mg into the skin once a week.   phentermine 37.5 MG capsule Take 1 capsule (37.5 mg total) by mouth every morning.   testosterone cypionate 200 MG/ML injection Commonly known as: DEPOTESTOSTERONE CYPIONATE Inject 0.5 mLs (100 mg total) into the muscle every 7 (seven) days.   triamcinolone cream 0.1 % Commonly known as: KENALOG Daily at bedtime for 2 weeks        Allergies: No Known Allergies  Family History: Family History  Problem Relation Age of Onset   Diabetes Mother    Hyperlipidemia Mother    Hyperlipidemia Father    Prostate cancer Paternal Grandfather     Social History:  reports that he has quit smoking. His smokeless tobacco use includes snuff. He reports current alcohol use. He reports that he does not use drugs.  ROS: Pertinent ROS in HPI  Physical Exam: There were no vitals taken for this visit.  Constitutional:  Well nourished. Alert and oriented, No acute distress. HEENT: [place] AT, moist mucus membranes.  Trachea midline, no masses. Cardiovascular: No clubbing, cyanosis, or edema. Respiratory: Normal respiratory effort, no increased work of breathing. GI: Abdomen is soft, non tender, non distended, no abdominal masses. Liver and spleen not palpable.  No hernias appreciated.  Stool sample for occult testing  is not indicated.   GU: No CVA tenderness.  No bladder fullness or masses.  Patient with circumcised/uncircumcised phallus. ***Foreskin easily retracted***  Urethral meatus is patent.  No penile discharge. No penile lesions or rashes. Scrotum without lesions, cysts, rashes and/or edema.  Testicles are located scrotally bilaterally. No masses are appreciated in the testicles. Left and right epididymis are normal. Rectal: Patient with  normal sphincter tone. Anus and perineum without scarring or rashes. No rectal masses are appreciated. Prostate is approximately *** grams, *** nodules are appreciated. Seminal vesicles  are normal. Skin: No rashes, bruises or suspicious lesions. Lymph: No cervical or inguinal adenopathy. Neurologic: Grossly intact, no focal deficits, moving all 4 extremities. Psychiatric: Normal mood and affect.  Laboratory Data: Lab Results  Component Value Date   WBC 5.9 04/21/2020   HGB 17.1 09/14/2021   HCT 51.0 09/14/2021   MCV 88 04/21/2020   PLT 273 04/21/2020    Lab Results  Component Value Date   CREATININE 1.33 (H) 04/21/2021    Lab Results  Component Value Date   TESTOSTERONE 703 09/14/2021     Lab Results  Component Value Date   TSH 1.200 04/21/2021       Component Value Date/Time   CHOL 165 04/21/2021 0848   HDL 36 (L) 04/21/2021 0848   CHOLHDL 4.6 04/21/2021 0848   LDLCALC 113 (H) 04/21/2021 0848    Lab Results  Component Value Date   AST 19 04/21/2021   Lab Results  Component Value Date   ALT 17 04/21/2021    Prostate Specific Ag, Serum  Latest Ref Rng 0.0 - 4.0 ng/mL  10/01/2016 1.7   01/09/2018 1.0   03/31/2019 1.2   04/21/2020 1.0   04/21/2021 1.1   09/14/2021 1.5   I have reviewed the labs.   Pertinent Imaging: N/A     Assessment & Plan:  ***  1. Testosterone deficiency  -testosterone levels are therapeutic -H & H WNL -continue ***  2. BPH with LUTS -PSA stable -DRE benign -UA benign -PVR < 300 cc -symptoms - *** -most bothersome symptoms are *** -continue conservative management, avoiding bladder irritants and timed voiding's -Initiate alpha-blocker (***), discussed side effects *** -Initiate 5 alpha reductase inhibitor (***), discussed side effects *** -Continue tamsulosin 0.4 mg daily, alfuzosin 10 mg daily, Rapaflo 8 mg daily, terazosin, doxazosin, Cialis 5 mg daily and finasteride 5 mg daily, dutasteride 0.5 mg daily***:refills given -Cannot tolerate medication or medication failure, schedule cystoscopy ***  3. Erectile dysfunction:    - I explained that conditions like diabetes, hypertension, coronary artery  disease, peripheral vascular disease, smoking, alcohol consumption, age, sleep apnea and BPH can diminish the ability to have an erection - I explained the ED may be a risk marker for underlying CVD and he should follow up with PCP for further studies *** - A recent study published in Sex Med 2018 Apr 13 revealed moderate to vigorous aerobic exercise for 40 minutes 4 times per week can decrease erectile problems caused by physical inactivity, obesity, hypertension, metabolic syndrome and/or cardiovascular diseases *** - We discussed trying a *** different PDE5 inhibitor, intra-urethral suppositories, intracavernous vasoactive drug injection therapy, vacuum erection devices and penile prosthesis implantation         No follow-ups on file.  These notes generated with voice recognition software. I apologize for typographical errors.  Zara Council, PA-C  [facility] [address]  [place], [place] [address] [phone]

## 2021-09-19 ENCOUNTER — Other Ambulatory Visit: Payer: Self-pay

## 2021-09-19 ENCOUNTER — Encounter: Payer: Self-pay | Admitting: Urology

## 2021-09-19 ENCOUNTER — Ambulatory Visit: Payer: BC Managed Care – PPO | Admitting: Urology

## 2021-09-19 VITALS — BP 137/88 | HR 69 | Ht 72.0 in | Wt 259.0 lb

## 2021-09-19 DIAGNOSIS — R7989 Other specified abnormal findings of blood chemistry: Secondary | ICD-10-CM

## 2021-09-19 DIAGNOSIS — E349 Endocrine disorder, unspecified: Secondary | ICD-10-CM

## 2021-09-19 DIAGNOSIS — R972 Elevated prostate specific antigen [PSA]: Secondary | ICD-10-CM

## 2021-09-19 DIAGNOSIS — E291 Testicular hypofunction: Secondary | ICD-10-CM

## 2021-09-19 DIAGNOSIS — N401 Enlarged prostate with lower urinary tract symptoms: Secondary | ICD-10-CM

## 2021-09-19 DIAGNOSIS — R3912 Poor urinary stream: Secondary | ICD-10-CM

## 2021-09-19 DIAGNOSIS — N529 Male erectile dysfunction, unspecified: Secondary | ICD-10-CM

## 2021-09-19 MED ORDER — "BD DISP NEEDLES 18G X 1-1/2"" MISC": 1.0000 mg | 0 refills | Status: DC

## 2021-09-19 MED ORDER — "BD DISP NEEDLES 21G X 1-1/2"" MISC": 1.0000 mg | 3 refills | Status: DC

## 2021-09-19 MED ORDER — TESTOSTERONE CYPIONATE 200 MG/ML IM SOLN: 100.0000 mg | INTRAMUSCULAR | 0 refills | Status: DC

## 2021-09-19 MED ORDER — SYRINGE 2-3 ML 3 ML MISC: 1.0000 mg | 3 refills | Status: DC

## 2021-10-05 ENCOUNTER — Encounter: Payer: Self-pay | Admitting: Gastroenterology

## 2021-10-06 ENCOUNTER — Ambulatory Visit
Admission: RE | Admit: 2021-10-06 | Discharge: 2021-10-06 | Disposition: A | Discharge status: Home / Self Care | Payer: BC Managed Care – PPO | Attending: Gastroenterology | Admitting: Gastroenterology

## 2021-10-06 ENCOUNTER — Ambulatory Visit: Payer: BC Managed Care – PPO | Admitting: General Practice

## 2021-10-06 ENCOUNTER — Encounter: Payer: Self-pay | Admitting: Gastroenterology

## 2021-10-06 ENCOUNTER — Encounter
Admission: RE | Disposition: A | Discharge status: Home / Self Care | Payer: Self-pay | Source: Home / Self Care | Attending: Gastroenterology

## 2021-10-06 DIAGNOSIS — Z87891 Personal history of nicotine dependence: Secondary | ICD-10-CM | POA: Diagnosis not present

## 2021-10-06 DIAGNOSIS — Z9049 Acquired absence of other specified parts of digestive tract: Secondary | ICD-10-CM | POA: Diagnosis not present

## 2021-10-06 DIAGNOSIS — K640 First degree hemorrhoids: Secondary | ICD-10-CM | POA: Insufficient documentation

## 2021-10-06 DIAGNOSIS — Z1211 Encounter for screening for malignant neoplasm of colon: Secondary | ICD-10-CM | POA: Insufficient documentation

## 2021-10-06 DIAGNOSIS — K635 Polyp of colon: Secondary | ICD-10-CM | POA: Diagnosis not present

## 2021-10-06 DIAGNOSIS — K5730 Diverticulosis of large intestine without perforation or abscess without bleeding: Secondary | ICD-10-CM | POA: Insufficient documentation

## 2021-10-06 DIAGNOSIS — D124 Benign neoplasm of descending colon: Secondary | ICD-10-CM | POA: Diagnosis not present

## 2021-10-06 HISTORY — PX: COLONOSCOPY WITH PROPOFOL: SHX5780

## 2021-10-06 SURGERY — COLONOSCOPY WITH PROPOFOL
Anesthesia: General

## 2021-10-06 MED ORDER — SODIUM CHLORIDE 0.9 % IV SOLN
INTRAVENOUS | Status: DC
  Administered 2021-10-06: 1000 mL via INTRAVENOUS

## 2021-10-06 MED ORDER — PROPOFOL 500 MG/50ML IV EMUL
INTRAVENOUS | Status: DC | PRN
  Administered 2021-10-06: 140 ug/kg/min via INTRAVENOUS

## 2021-10-06 MED ORDER — LIDOCAINE HCL (CARDIAC) PF 100 MG/5ML IV SOSY
PREFILLED_SYRINGE | INTRAVENOUS | Status: DC | PRN
  Administered 2021-10-06: 80 mg via INTRAVENOUS

## 2021-10-06 MED ORDER — PROPOFOL 10 MG/ML IV BOLUS
INTRAVENOUS | Status: DC | PRN
  Administered 2021-10-06: 70 mg via INTRAVENOUS

## 2021-10-06 NOTE — Op Note (Signed)
[facility] Gastroenterology Patient Name: David Aguirre Procedure Date: 10/06/2021 10:33 AM MRN: [id number] Account #: [id number] Date of Birth: Oct 25, 1975 Admit Type: Outpatient Age: 46 Room: [facility] ENDO ROOM 4 Gender: Male Note Status: Finalized Instrument Name: Jasper Riling [id number] Procedure:             Colonoscopy Indications:           Screening for colorectal malignant neoplasm Providers:             [name] MD, MD Referring MD:          Janine Ores. Rosanna Randy, MD (Referring MD) Medicines:             Propofol per Anesthesia Complications:         No immediate complications. Procedure:             Pre-Anesthesia Assessment:                        - Prior to the procedure, a History and Physical was                         performed, and patient medications and allergies were                         reviewed. The patient's tolerance of previous                         anesthesia was also reviewed. The risks and benefits                         of the procedure and the sedation options and risks                         were discussed with the patient. All questions were                         answered, and informed consent was obtained. Prior                         Anticoagulants: The patient has taken no previous                         anticoagulant or antiplatelet agents. ASA Grade                         Assessment: II - A patient with mild systemic disease.                         After reviewing the risks and benefits, the patient                         was deemed in satisfactory condition to undergo the                         procedure.                        After obtaining informed consent, the colonoscope was  passed under direct vision. Throughout the procedure,                         the patient's blood pressure, pulse, and oxygen                         saturations were monitored continuously. The                          Colonoscope was introduced through the anus and                         advanced to the the cecum, identified by appendiceal                         orifice and ileocecal valve. The colonoscopy was                         performed without difficulty. The patient tolerated                         the procedure well. The quality of the bowel                         preparation was excellent. Findings:      The perianal and digital rectal examinations were normal.      A 3 mm polyp was found in the descending colon. The polyp was sessile.       The polyp was removed with a cold biopsy forceps. Resection and       retrieval were complete.      Non-bleeding internal hemorrhoids were found during retroflexion. The       hemorrhoids were Grade I (internal hemorrhoids that do not prolapse).      Multiple small-mouthed diverticula were found in the sigmoid colon. Impression:            - One 3 mm polyp in the descending colon, removed with                         a cold biopsy forceps. Resected and retrieved.                        - Non-bleeding internal hemorrhoids.                        - Diverticulosis in the sigmoid colon. Recommendation:        - Discharge patient to home.                        - Resume previous diet.                        - Continue present medications.                        - Await pathology results.                        - If the pathology report reveals adenomatous tissue,  then repeat the colonoscopy for surveillance in 7                         years. Procedure Code(s):     --- Professional ---                        [phone], Colonoscopy, flexible; with biopsy, single or                         multiple Diagnosis Code(s):     --- Professional ---                        Z12.11, Encounter for screening for malignant neoplasm                         of colon                        K63.5, Polyp of colon CPT copyright 2019 American Medical  Association. All rights reserved. The codes documented in this report are preliminary and upon coder review may  be revised to meet current compliance requirements. [name] MD, MD 10/06/2021 10:53:48 AM This report has been signed electronically. Number of Addenda: 0 Note Initiated On: 10/06/2021 10:33 AM Scope Withdrawal Time: 0 hours 7 minutes 47 seconds  Total Procedure Duration: 0 hours 13 minutes 26 seconds  Estimated Blood Loss:  Estimated blood loss: none. Estimated blood loss: none.      [facility]

## 2021-10-06 NOTE — Anesthesia Preprocedure Evaluation (Signed)
Anesthesia Evaluation  Patient identified by MRN, date of birth, ID band Patient awake    Reviewed: Allergy & Precautions, NPO status , Patient's Chart, lab work & pertinent test results  Airway Mallampati: IV  TM Distance: >3 FB Neck ROM: full    Dental  (+) Teeth Intact   Pulmonary neg pulmonary ROS, neg shortness of breath, neg COPD, former smoker,    Pulmonary exam normal        Cardiovascular (-) CAD, (-) Past MI and (-) CABG negative cardio ROS Normal cardiovascular exam(-) pacemaker     Neuro/Psych PSYCHIATRIC DISORDERS negative neurological ROS     GI/Hepatic negative GI ROS, Neg liver ROS,   Endo/Other  negative endocrine ROS  Renal/GU negative Renal ROS  negative genitourinary   Musculoskeletal   Abdominal   Peds  Hematology negative hematology ROS (+)   Anesthesia Other Findings Past Medical History: No date: Low testosterone  Past Surgical History: 11/28/2014: CHOLECYSTECTOMY; N/A     Comment:  Procedure: LAPAROSCOPIC CHOLECYSTECTOMY WITH               INTRAOPERATIVE CHOLANGIOGRAM;  Surgeon: Florene Glen,              MD;  Location: [facility] ORS;  Service: General;  Laterality:               N/A; No date: NASAL SEPTUM SURGERY No date: NASAL SINUS SURGERY No date: TONSILLECTOMY No date: TURBINATE REDUCTION; Bilateral  BMI    Body Mass Index: 33.98 kg/m      Reproductive/Obstetrics negative OB ROS                             Anesthesia Physical Anesthesia Plan  ASA: 2  Anesthesia Plan: General   Post-op Pain Management: Minimal or no pain anticipated   Induction: Intravenous  PONV Risk Score and Plan: 3 and Propofol infusion, TIVA and Ondansetron  Airway Management Planned: Nasal Cannula  Additional Equipment: None  Intra-op Plan:   Post-operative Plan:   Informed Consent: I have reviewed the patients History and Physical, chart, labs and discussed the  procedure including the risks, benefits and alternatives for the proposed anesthesia with the patient or authorized representative who has indicated his/her understanding and acceptance.     Dental advisory given  Plan Discussed with: CRNA and Surgeon  Anesthesia Plan Comments: (Discussed risks of anesthesia with patient, including possibility of difficulty with spontaneous ventilation under anesthesia necessitating airway intervention, PONV, and rare risks such as cardiac or respiratory or neurological events, and allergic reactions. Discussed the role of CRNA in patient's perioperative care. Patient understands.)        Anesthesia Quick Evaluation

## 2021-10-06 NOTE — H&P (Signed)
David Lame, MD [address] [place], [place] [address] Phone: [phone] Fax : [phone]  Primary Care Physician:  Jerrol Banana., MD Primary Gastroenterologist:  Dr. Allen Norris  Pre-Procedure History & Physical: HPI:  David Aguirre is a 46 y.o. male is here for a screening colonoscopy.   Past Medical History:  Diagnosis Date   Low testosterone     Past Surgical History:  Procedure Laterality Date   CHOLECYSTECTOMY N/A 11/28/2014   Procedure: LAPAROSCOPIC CHOLECYSTECTOMY WITH INTRAOPERATIVE CHOLANGIOGRAM;  Surgeon: Florene Glen, MD;  Location: [facility] ORS;  Service: General;  Laterality: N/A;   NASAL SEPTUM SURGERY     NASAL SINUS SURGERY     TONSILLECTOMY     TURBINATE REDUCTION Bilateral     Prior to Admission medications   Medication Sig Start Date End Date Taking? Authorizing Provider  FIBER PO Take by mouth.   Yes [provider]  MULTIPLE VITAMIN PO Take by mouth.   Yes [provider]  phentermine 37.5 MG capsule Take 1 capsule (37.5 mg total) by mouth every morning. 08/22/21  Yes Jerrol Banana., MD  testosterone cypionate (DEPOTESTOSTERONE CYPIONATE) 200 MG/ML injection Inject 0.5 mLs (100 mg total) into the muscle every 7 (seven) days. 09/19/21  Yes McGowan, Larene Beach A, PA-C  triamcinolone cream (KENALOG) 0.1 % Daily at bedtime for 2 weeks 03/24/19  Yes Jerrol Banana., MD  albuterol (VENTOLIN HFA) 108 (90 Base) MCG/ACT inhaler Inhale 2 puffs into the lungs every 4 (four) hours as needed for wheezing or shortness of breath. 08/12/19   Rodriguez-Southworth, Sunday Spillers, PA-C  finasteride (PROSCAR) 5 MG tablet Take 5 mg by mouth daily. Patient not taking: Reported on 09/19/2021 03/19/21   [provider]  NEEDLE, DISP, 18 G (BD DISP NEEDLES) 18G X 1-1/2" MISC 1 mg by Does not apply route every 14 (fourteen) days. Patient not taking: Reported on 10/06/2021 09/19/21   Zara Council A, PA-C  NEEDLE, DISP, 21 G (BD  DISP NEEDLES) 21G X 1-1/2" MISC 1 mg by Does not apply route every 14 (fourteen) days. Patient not taking: Reported on 10/06/2021 09/19/21   Zara Council A, PA-C  Semaglutide,0.25 or 0.'5MG'$ /DOS, (OZEMPIC, 0.25 OR 0.5 MG/DOSE,) 2 MG/1.5ML SOPN Inject 0.25 mg into the skin once a week. Patient not taking: Reported on 10/06/2021 08/17/21   Jerrol Banana., MD  Syringe, Disposable, (2-3CC SYRINGE) 3 ML MISC 1 mg by Does not apply route every 14 (fourteen) days. Patient not taking: Reported on 10/06/2021 09/19/21   Zara Council A, PA-C    Allergies as of 08/18/2021   (No Known Allergies)    Family History  Problem Relation Age of Onset   Diabetes Mother    Hyperlipidemia Mother    Hyperlipidemia Father    Prostate cancer Paternal Grandfather     Social History   Socioeconomic History   Marital status: Married    Spouse name: Not on file   Number of children: Not on file   Years of education: Not on file   Highest education level: Not on file  Occupational History   Not on file  Tobacco Use   Smoking status: Former   Smokeless tobacco: Current    Types: Snuff  Vaping Use   Vaping Use: Never used  Substance and Sexual Activity   Alcohol use: Yes    Comment: 2-3 times a week   Drug use: No   Sexual activity: Not on file  Other  Topics Concern   Not on file  Social History Narrative   Not on file   Social Determinants of Health   Financial Resource Strain: Not on file  Food Insecurity: Not on file  Transportation Needs: Not on file  Physical Activity: Not on file  Stress: Not on file  Social Connections: Not on file  Intimate Partner Violence: Not on file    Review of Systems: See HPI, otherwise negative ROS  Physical Exam: BP (!) 155/87   Pulse 73   Temp (!) 96.6 F (35.9 C) (Temporal)   Resp 18   Ht 6' (1.829 m)   Wt 113.6 kg   SpO2 100%   BMI 33.98 kg/m  General:   Alert,  pleasant and cooperative in NAD Head:  Normocephalic and  atraumatic. Neck:  Supple; no masses or thyromegaly. Lungs:  Clear throughout to auscultation.    Heart:  Regular rate and rhythm. Abdomen:  Soft, nontender and nondistended. Normal bowel sounds, without guarding, and without rebound.   Neurologic:  Alert and  oriented x4;  grossly normal neurologically.  Impression/Plan: David Aguirre is now here to undergo a screening colonoscopy.  Risks, benefits, and alternatives regarding colonoscopy have been reviewed with the patient.  Questions have been answered.  All parties agreeable.

## 2021-10-06 NOTE — Transfer of Care (Signed)
Immediate Anesthesia Transfer of Care Note  [name]  Procedure(s) Performed: COLONOSCOPY WITH PROPOFOL  Patient Location: PACU  Anesthesia Type:General  Level of Consciousness: awake, alert  and oriented  Airway & Oxygen Therapy: Patient connected to nasal cannula oxygen  Post-op Assessment: Report given to RN and Post -op Vital signs reviewed and stable  Post vital signs: Reviewed and stable  Last Vitals:  Vitals Value Taken Time  BP 133/76 10/06/21 1056  Temp 35.7 C 10/06/21 1055  Pulse 86 10/06/21 1056  Resp 17 10/06/21 1056  SpO2 98 % 10/06/21 1056  Vitals shown include unvalidated device data.  Last Pain:  Vitals:   10/06/21 1055  TempSrc: Temporal  PainSc: 0-No pain         Complications: No notable events documented.

## 2021-10-07 NOTE — Anesthesia Postprocedure Evaluation (Signed)
Anesthesia Post Note  [name]  Procedure(s) Performed: COLONOSCOPY WITH PROPOFOL  Patient location during evaluation: Endoscopy Anesthesia Type: General Level of consciousness: awake and alert Pain management: pain level controlled Vital Signs Assessment: post-procedure vital signs reviewed and stable Respiratory status: spontaneous breathing, nonlabored ventilation, respiratory function stable and patient connected to nasal cannula oxygen Cardiovascular status: blood pressure returned to baseline and stable Postop Assessment: no apparent nausea or vomiting Anesthetic complications: no   No notable events documented.   Last Vitals:  Vitals:   10/06/21 1055 10/06/21 1115  BP: 133/76 (!) 134/91  Pulse:    Resp:    Temp: (!) 35.7 C   SpO2:  98%    Last Pain:  Vitals:   10/06/21 1115  TempSrc:   PainSc: 0-No pain                 Dimas Millin

## 2021-10-09 ENCOUNTER — Encounter: Payer: Self-pay | Admitting: Gastroenterology

## 2021-10-09 LAB — SURGICAL PATHOLOGY

## 2021-10-23 ENCOUNTER — Ambulatory Visit: Payer: BC Managed Care – PPO | Admitting: Family Medicine

## 2021-10-27 NOTE — Progress Notes (Unsigned)
Established patient visit  I,April Miller,acting as a scribe for Wilhemena Durie, MD.,have documented all relevant documentation on the behalf of Wilhemena Durie, MD,as directed by  Wilhemena Durie, MD while in the presence of Wilhemena Durie, MD.   [name]   DOB: [birth year]   46 y.o. Male  MRN: [id number] Visit Date: 10/31/2021  Today's healthcare provider: Wilhemena Durie, MD   Chief Complaint  Patient presents with   Follow-up   Prediabetes   Subjective    HPI  Prediabetes, Follow-up  Lab Results  Component Value Date   HGBA1C 5.5 10/01/2016   HGBA1C 5.3 10/14/2015   GLUCOSE 104 (H) 04/21/2021   GLUCOSE 94 10/21/2020   GLUCOSE 92 04/21/2020    Last seen for for this2 months ago.  Management since that visit includes; Follow-up in 2 to 3 months for tolerance and to follow-up sugar and weight.   Pertinent Labs:    Component Value Date/Time   CHOL 165 04/21/2021 0848   TRIG 82 04/21/2021 0848   CHOLHDL 4.6 04/21/2021 0848   CREATININE 1.33 (H) 04/21/2021 0848    Wt Readings from Last 3 Encounters:  10/31/21 260 lb (117.9 kg)  10/06/21 250 lb 8.1 oz (113.6 kg)  09/19/21 259 lb (117.5 kg)    -----------------------------------------------------------------------------------------   Medications: Outpatient Medications Prior to Visit  Medication Sig   albuterol (VENTOLIN HFA) 108 (90 Base) MCG/ACT inhaler Inhale 2 puffs into the lungs every 4 (four) hours as needed for wheezing or shortness of breath.   FIBER PO Take by mouth.   MULTIPLE VITAMIN PO Take by mouth.   phentermine 37.5 MG capsule Take 1 capsule (37.5 mg total) by mouth every morning.   testosterone cypionate (DEPOTESTOSTERONE CYPIONATE) 200 MG/ML injection Inject 0.5 mLs (100 mg total) into the muscle every 7 (seven) days.   triamcinolone cream (KENALOG) 0.1 % Daily at bedtime for 2 weeks   finasteride (PROSCAR) 5 MG tablet Take 5 mg by mouth daily. (Patient  not taking: Reported on 10/31/2021)   NEEDLE, DISP, 18 G (BD DISP NEEDLES) 18G X 1-1/2" MISC 1 mg by Does not apply route every 14 (fourteen) days. (Patient not taking: Reported on 10/06/2021)   NEEDLE, DISP, 21 G (BD DISP NEEDLES) 21G X 1-1/2" MISC 1 mg by Does not apply route every 14 (fourteen) days. (Patient not taking: Reported on 10/06/2021)   Semaglutide,0.25 or 0.'5MG'$ /DOS, (OZEMPIC, 0.25 OR 0.5 MG/DOSE,) 2 MG/1.5ML SOPN Inject 0.25 mg into the skin once a week. (Patient not taking: Reported on 10/06/2021)   Syringe, Disposable, (2-3CC SYRINGE) 3 ML MISC 1 mg by Does not apply route every 14 (fourteen) days. (Patient not taking: Reported on 10/06/2021)   No facility-administered medications prior to visit.    Review of Systems  Constitutional:  Negative for appetite change, chills and fever.  Respiratory:  Negative for chest tightness, shortness of breath and wheezing.   Cardiovascular:  Negative for chest pain and palpitations.  Gastrointestinal:  Negative for abdominal pain, nausea and vomiting.    Last hemoglobin A1c Lab Results  Component Value Date   HGBA1C 5.5 10/01/2016       Objective    BP (!) 138/90 (BP Location: Left Arm, Patient Position: Sitting, Cuff Size: Large)   Pulse 79   Resp 16   Wt 260 lb (117.9 kg)   SpO2 100%   BMI 35.26 kg/m  BP Readings from Last 3 Encounters:  10/31/21 (!) 138/90  10/06/21 (!) 134/91  09/19/21 137/88   Wt Readings from Last 3 Encounters:  10/31/21 260 lb (117.9 kg)  10/06/21 250 lb 8.1 oz (113.6 kg)  09/19/21 259 lb (117.5 kg)      Physical Exam  ***  No results found for any visits on 10/31/21.  Assessment & Plan     1. Prediabetes   2. Lateral epicondylitis of left elbow  - naproxen (NAPROSYN) 500 MG tablet; Take 1 tablet (500 mg total) by mouth 2 (two) times daily with a meal.  Dispense: 60 tablet; Refill: 2  3. Anxiety  - sertraline (ZOLOFT) 50 MG tablet; Take 1 tablet (50 mg total) by mouth daily.  Dispense: 30  tablet; Refill: 11   Return in about 6 weeks (around 12/12/2021).      {provider attestation***:1}   Wilhemena Durie, MD  [facility] [phone] (phone) [phone] (fax)  [place]

## 2021-10-31 ENCOUNTER — Encounter: Payer: Self-pay | Admitting: Family Medicine

## 2021-10-31 ENCOUNTER — Ambulatory Visit: Payer: BC Managed Care – PPO | Admitting: Family Medicine

## 2021-10-31 VITALS — BP 138/90 | HR 79 | Resp 16 | Wt 260.0 lb

## 2021-10-31 DIAGNOSIS — M7712 Lateral epicondylitis, left elbow: Secondary | ICD-10-CM | POA: Diagnosis not present

## 2021-10-31 DIAGNOSIS — E291 Testicular hypofunction: Secondary | ICD-10-CM

## 2021-10-31 DIAGNOSIS — R7303 Prediabetes: Secondary | ICD-10-CM

## 2021-10-31 DIAGNOSIS — Z6835 Body mass index (BMI) 35.0-35.9, adult: Secondary | ICD-10-CM

## 2021-10-31 DIAGNOSIS — F419 Anxiety disorder, unspecified: Secondary | ICD-10-CM | POA: Diagnosis not present

## 2021-10-31 DIAGNOSIS — E6609 Other obesity due to excess calories: Secondary | ICD-10-CM

## 2021-10-31 MED ORDER — SERTRALINE HCL 50 MG PO TABS: 50.0000 mg | ORAL_TABLET | Freq: Every day | ORAL | 11 refills | Status: DC

## 2021-10-31 MED ORDER — NAPROXEN 500 MG PO TABS: 500.0000 mg | ORAL_TABLET | Freq: Two times a day (BID) | ORAL | 2 refills | Status: AC

## 2021-11-06 NOTE — Telephone Encounter (Signed)
error 

## 2021-11-27 DIAGNOSIS — L648 Other androgenic alopecia: Secondary | ICD-10-CM | POA: Diagnosis not present

## 2021-11-27 DIAGNOSIS — Z86018 Personal history of other benign neoplasm: Secondary | ICD-10-CM | POA: Diagnosis not present

## 2021-11-27 DIAGNOSIS — L578 Other skin changes due to chronic exposure to nonionizing radiation: Secondary | ICD-10-CM | POA: Diagnosis not present

## 2021-12-11 NOTE — Progress Notes (Unsigned)
I,David Aguirre,acting as a scribe for [employer], MD.,have documented all relevant documentation on the behalf of David Foster, MD,as directed by  David Foster, MD while in the presence of David Foster, MD.   Established patient visit   [name]   DOB: 01-29-1976   46 y.o. Male  MRN: [id number] Visit Date: 12/12/2021  Today's healthcare provider: [name], MD   Chief Complaint  Patient presents with   Follow-Up Anxiety   Subjective    HPI   Anxiety, Follow-up  He was last seen for anxiety 6 weeks ago. Changes made at last visit include starting Zoloft 50 mg.   He reports excellent compliance with treatment. He reports poor tolerance of treatment. He is having side effects. Drowsy. Patient stop taking.  He feels his anxiety is mild and Improved since last visit.  Symptoms: No chest pain No difficulty concentrating  No dizziness No fatigue  No feelings of losing control No insomnia  No irritable No palpitations  No panic attacks No racing thoughts  No shortness of breath No sweating  No tremors/shakes    GAD-7 Results    12/12/2021    8:14 AM  GAD-7 Generalized Anxiety Disorder Screening Tool  1. Feeling Nervous, Anxious, or on Edge 0  2. Not Being Able to Stop or Control Worrying 0  3. Worrying Too Much About Different Things 0  4. Trouble Relaxing 0  5. Being So Restless it's Hard To Sit Still 0  6. Becoming Easily Annoyed or Irritable 1  7. Feeling Afraid As If Something Awful Might Happen 0  Total GAD-7 Score 1  Difficulty At Work, Home, or Getting  Along With Others? Not difficult at all    PHQ-9 Scores    10/31/2021    8:13 AM 08/17/2021   10:25 AM 04/20/2021    2:42 PM  PHQ9 SCORE ONLY  PHQ-9 Total Score 2 1 0    ---------------------------------------------------------------------------------------------------  Elevated BP  Patient reports having two cups  of coffee this AM  He states that he has also had his dose of phentermine for today He also notes that since starting testosterone, he has noted increase in his blood pressure He denies headache, blurry vision or dizziness He states that his blood pressure normally ranges in the 120s/70-80   Medications: Outpatient Medications Prior to Visit  Medication Sig   finasteride (PROSCAR) 5 MG tablet Take 5 mg by mouth daily.   MULTIPLE VITAMIN PO Take by mouth.   naproxen (NAPROSYN) 500 MG tablet Take 1 tablet (500 mg total) by mouth 2 (two) times daily with a meal.   phentermine 37.5 MG capsule Take 1 capsule (37.5 mg total) by mouth every morning.   testosterone cypionate (DEPOTESTOSTERONE CYPIONATE) 200 MG/ML injection Inject 0.5 mLs (100 mg total) into the muscle every 7 (seven) days.   triamcinolone cream (KENALOG) 0.1 % Daily at bedtime for 2 weeks   [DISCONTINUED] albuterol (VENTOLIN HFA) 108 (90 Base) MCG/ACT inhaler Inhale 2 puffs into the lungs every 4 (four) hours as needed for wheezing or shortness of breath.   [DISCONTINUED] FIBER PO Take by mouth.   [DISCONTINUED] NEEDLE, DISP, 18 G (BD DISP NEEDLES) 18G X 1-1/2" MISC 1 mg by Does not apply route every 14 (fourteen) days.   [DISCONTINUED] NEEDLE, DISP, 21 G (BD DISP NEEDLES) 21G X 1-1/2" MISC 1 mg by Does not apply route every 14 (fourteen) days.   [DISCONTINUED] Semaglutide,0.25 or 0.'5MG'$ /DOS, (OZEMPIC, 0.25 OR 0.5  MG/DOSE,) 2 MG/1.5ML SOPN Inject 0.25 mg into the skin once a week.   [DISCONTINUED] sertraline (ZOLOFT) 50 MG tablet Take 1 tablet (50 mg total) by mouth daily. (Patient not taking: Reported on 12/12/2021)   [DISCONTINUED] Syringe, Disposable, (2-3CC SYRINGE) 3 ML MISC 1 mg by Does not apply route every 14 (fourteen) days.   No facility-administered medications prior to visit.    Review of Systems     Objective    BP (!) 156/96 (BP Location: Right Wrist, Patient Position: Sitting, Cuff Size: Normal)   Pulse 86    Temp 98.7 F (37.1 C) (Oral)   Resp 16   Wt 244 lb (110.7 kg)   BMI 33.09 kg/m    Physical Exam Vitals reviewed.  Constitutional:      General: He is not in acute distress.    Appearance: Normal appearance. He is not ill-appearing, toxic-appearing or diaphoretic.  Eyes:     Conjunctiva/sclera: Conjunctivae normal.  Cardiovascular:     Rate and Rhythm: Normal rate and regular rhythm.     Pulses: Normal pulses.     Heart sounds: Normal heart sounds. No murmur heard.    No friction rub. No gallop.  Pulmonary:     Effort: Pulmonary effort is normal. No respiratory distress.     Breath sounds: Normal breath sounds. No stridor. No wheezing, rhonchi or rales.  Neurological:     Mental Status: He is alert and oriented to person, place, and time.  Psychiatric:        Attention and Perception: Attention normal.        Mood and Affect: Mood and affect normal.        Speech: Speech normal.        Behavior: Behavior normal. Behavior is cooperative.       No results found for any visits on 12/12/21.  Assessment & Plan     Problem List Items Addressed This Visit       Other   Elevated blood pressure reading in office without diagnosis of hypertension - Primary    Blood pressure was initially elevated to 145/92, remains elevated at 156/96 on repeat Patient without symptoms at this time We will have patient follow-up in 1 month Suspect that elevation related to medication and caffeine intake Can consider adding antihypertensive medication at follow-up visit in 1 month Recommend CMP at that time      Situational anxiety    This problem has resolved Patient states that this was related to work and after negotiating these issues, his anxiety is no longer a problem He is no longer taking the Zoloft, will discontinue this medication Patient advised to follow-up if feelings of anxiety return, he was in agreement with this plan No objective findings nor report of SI/HI nor auditory no  visual hallucinations         Return in about 1 month (around 01/12/2022) for Elevated blood pressure .      Cain Sieve Simmons-Robinson MD, have reviewed all documentation for this visit. The documentation for the exam, diagnosis, procedures, and orders are all accurate and complete.    David Foster, MD  [facility] [phone] (phone) [phone] (fax)  [place]

## 2021-12-12 ENCOUNTER — Ambulatory Visit: Payer: BC Managed Care – PPO | Admitting: Family Medicine

## 2021-12-12 ENCOUNTER — Encounter: Payer: Self-pay | Admitting: Family Medicine

## 2021-12-12 VITALS — BP 156/96 | HR 86 | Temp 98.7°F | Resp 16 | Wt 244.0 lb

## 2021-12-12 DIAGNOSIS — F418 Other specified anxiety disorders: Secondary | ICD-10-CM | POA: Insufficient documentation

## 2021-12-12 DIAGNOSIS — R030 Elevated blood-pressure reading, without diagnosis of hypertension: Secondary | ICD-10-CM | POA: Insufficient documentation

## 2021-12-12 NOTE — Assessment & Plan Note (Signed)
This problem has resolved Patient states that this was related to work and after negotiating these issues, his anxiety is no longer a problem He is no longer taking the Zoloft, will discontinue this medication Patient advised to follow-up if feelings of anxiety return, he was in agreement with this plan No objective findings nor report of SI/HI nor auditory no visual hallucinations

## 2021-12-12 NOTE — Assessment & Plan Note (Signed)
Blood pressure was initially elevated to 145/92, remains elevated at 156/96 on repeat Patient without symptoms at this time We will have patient follow-up in 1 month Suspect that elevation related to medication and caffeine intake Can consider adding antihypertensive medication at follow-up visit in 1 month Recommend CMP at that time

## 2021-12-21 ENCOUNTER — Other Ambulatory Visit: Payer: BC Managed Care – PPO

## 2021-12-21 DIAGNOSIS — E349 Endocrine disorder, unspecified: Secondary | ICD-10-CM | POA: Diagnosis not present

## 2021-12-22 LAB — HEMOGLOBIN AND HEMATOCRIT, BLOOD
Hematocrit: 49.1 % (ref 37.5–51.0)
Hemoglobin: 16.7 g/dL (ref 13.0–17.7)

## 2021-12-22 LAB — TESTOSTERONE: Testosterone: 609 ng/dL (ref 264–916)

## 2022-01-04 NOTE — Telephone Encounter (Signed)
error 

## 2022-01-15 NOTE — Progress Notes (Unsigned)
I,[name],acting as a scribe for [employer], MD.,have documented all relevant documentation on the behalf of David Foster, MD,as directed by  David Foster, MD while in the presence of David Foster, MD.   Established patient visit   [name]   DOB: 05/30/1975   46 y.o. Male  MRN: [id number] Visit Date: 01/16/2022  Today's healthcare provider: [name], MD   Chief Complaint  Patient presents with   Follow-up HTN   Subjective    HPI  Hypertension, follow-up  BP Readings from Last 3 Encounters:  01/16/22 120/84  12/12/21 (!) 156/96  10/31/21 (!) 138/90   Wt Readings from Last 3 Encounters:  01/16/22 260 lb (117.9 kg)  12/12/21 244 lb (110.7 kg)  10/31/21 260 lb (117.9 kg)     He was last seen for hypertension 1 months ago.  BP at that visit was 156/96. Management since that visit includes consider adding antihypertensive medication at follow-up visit in 1 month.  Patient is also taking phentermine for weight loss.   He reports excellent compliance with treatment. He is not having side effects.   He does not smoke, reports quitting 20 years ago.   Outside blood pressures are not being checked. Symptoms: No chest pain No chest pressure  No palpitations No syncope  No dyspnea No orthopnea  No paroxysmal nocturnal dyspnea No lower extremity edema   Pertinent labs Lab Results  Component Value Date   CHOL 165 04/21/2021   HDL 36 (L) 04/21/2021   LDLCALC 113 (H) 04/21/2021   TRIG 82 04/21/2021   CHOLHDL 4.6 04/21/2021   Lab Results  Component Value Date   NA 141 04/21/2021   K 5.0 04/21/2021   CREATININE 1.33 (H) 04/21/2021   EGFR 68 04/21/2021   GLUCOSE 104 (H) 04/21/2021   TSH 1.200 04/21/2021     The 10-year ASCVD risk score (Arnett DK, et al., 2019) is: 2%  ---------------------------------------------------------------------------------------------------    Medications: Outpatient Medications Prior to Visit  Medication Sig   finasteride (PROSCAR) 5 MG tablet Take 5 mg by mouth daily.   MULTIPLE VITAMIN PO Take by mouth.   naproxen (NAPROSYN) 500 MG tablet Take 1 tablet (500 mg total) by mouth 2 (two) times daily with a meal.   phentermine 37.5 MG capsule Take 1 capsule (37.5 mg total) by mouth every morning.   testosterone cypionate (DEPOTESTOSTERONE CYPIONATE) 200 MG/ML injection Inject 0.5 mLs (100 mg total) into the muscle every 7 (seven) days.   triamcinolone cream (KENALOG) 0.1 % Daily at bedtime for 2 weeks   No facility-administered medications prior to visit.    Review of Systems     Objective    BP 120/84 (BP Location: Left Arm, Patient Position: Sitting, Cuff Size: Large)   Pulse 80   Resp 16   Wt 260 lb (117.9 kg)   BMI 35.26 kg/m    Physical Exam Vitals reviewed.  Constitutional:      General: He is not in acute distress.    Appearance: Normal appearance. He is not ill-appearing, toxic-appearing or diaphoretic.  Eyes:     Conjunctiva/sclera: Conjunctivae normal.  Cardiovascular:     Rate and Rhythm: Normal rate and regular rhythm.     Pulses: Normal pulses.     Heart sounds: Normal heart sounds. No murmur heard.    No friction rub. No gallop.  Pulmonary:     Effort: Pulmonary effort is normal. No respiratory distress.     Breath sounds:  Normal breath sounds. No stridor. No wheezing, rhonchi or rales.  Abdominal:     General: Bowel sounds are normal. There is no distension.     Palpations: Abdomen is soft.     Tenderness: There is no abdominal tenderness.  Musculoskeletal:     Right lower leg: No edema.     Left lower leg: No edema.  Skin:    Findings: No erythema or rash.  Neurological:     Mental Status: He is alert and oriented to person, place, and time.      No results found for any visits on 01/16/22.   Assessment & Plan     Problem List Items Addressed This Visit       Other   Weight  loss due to medication    Stable  Weight gain since last visit, went up from 244 to 260 which he states is his baseline  GOAL on phentermine is to lose 20lbs or decrease to 20% body fat  He reports exercising and lifting weight 5 days per week with general diet most days  Recommended aiming for balanced diet 80% of time  Will follow up in 3 months  CMP and TSH collected today       Relevant Orders   Comprehensive metabolic panel   TSH   Elevated blood pressure reading - Primary    BP is within goal range today  He did not have phentermine today  States that he normally does not check BP at home  Recommended the he check 3-4 times per week over the course of the next three months and notify the office of persistently elevated pressures, palpitations, HA  No medications added at this time  Will follow up in three months  TSH and CMP ordered today       Relevant Orders   Comprehensive metabolic panel   TSH   Elevated serum creatinine    Creatinine mildy elevated in Feb 2023  Recommended repeat CMP today       Relevant Orders   Comprehensive metabolic panel     Goals Addressed               This Visit's Progress     Weight (lb) < 200 lb (90.7 kg) (pt-stated)   260 lb (117.9 kg)     Patient's baseline 260lbs  Would like to be around the 240s or less than 20% body fat  Using anti-obesity medication, phentermine           Return in about 3 months (around 04/18/2022) for med management .      I, David Foster, MD, have reviewed all documentation for this visit.  Portions of this information were initially documented by the CMA and reviewed by me for thoroughness and accuracy.      David Foster, MD  [facility] [phone] (phone) [phone] (fax)  [place]

## 2022-01-16 ENCOUNTER — Encounter: Payer: Self-pay | Admitting: Family Medicine

## 2022-01-16 ENCOUNTER — Ambulatory Visit: Payer: BC Managed Care – PPO | Admitting: Family Medicine

## 2022-01-16 VITALS — BP 120/84 | HR 80 | Resp 16 | Wt 260.0 lb

## 2022-01-16 DIAGNOSIS — R030 Elevated blood-pressure reading, without diagnosis of hypertension: Secondary | ICD-10-CM

## 2022-01-16 DIAGNOSIS — R7989 Other specified abnormal findings of blood chemistry: Secondary | ICD-10-CM

## 2022-01-16 DIAGNOSIS — R634 Abnormal weight loss: Secondary | ICD-10-CM | POA: Diagnosis not present

## 2022-01-16 DIAGNOSIS — T50905A Adverse effect of unspecified drugs, medicaments and biological substances, initial encounter: Secondary | ICD-10-CM | POA: Diagnosis not present

## 2022-01-16 NOTE — Patient Instructions (Signed)
It was a pleasure to see you today!  Thank you for choosing [facility] for your primary care.   David Aguirre was seen for medication management and blood pressure check.   Our plans for today were: Please check your blood pressure 3-4 times per week over the next 3 months with goal less than 140/90 Notify our office if you begin to have abnormally increased heart rate (for example increased heart rate at rest), persistent headache, chest pain or blood pressures elevated.  Today we will check your kidney function and thyroid levels.  Your blood pressure today was within normal range  To keep you healthy, please keep in mind the following health maintenance items that you are due for:   COVID vaccine   You should return to our clinic in 3 months for weight loss medication follow up.   Best Wishes,   Dr. Quentin Cornwall

## 2022-01-16 NOTE — Assessment & Plan Note (Signed)
Creatinine mildy elevated in Feb 2023  Recommended repeat CMP today

## 2022-01-16 NOTE — Assessment & Plan Note (Signed)
BP is within goal range today  He did not have phentermine today  States that he normally does not check BP at home  Recommended the he check 3-4 times per week over the course of the next three months and notify the office of persistently elevated pressures, palpitations, HA  No medications added at this time  Will follow up in three months  TSH and CMP ordered today

## 2022-01-16 NOTE — Assessment & Plan Note (Signed)
Stable  Weight gain since last visit, went up from 244 to 260 which he states is his baseline  GOAL on phentermine is to lose 20lbs or decrease to 20% body fat  He reports exercising and lifting weight 5 days per week with general diet most days  Recommended aiming for balanced diet 80% of time  Will follow up in 3 months  CMP and TSH collected today

## 2022-01-17 LAB — COMPREHENSIVE METABOLIC PANEL
ALT: 29 IU/L (ref 0–44)
AST: 28 IU/L (ref 0–40)
Albumin/Globulin Ratio: 2.4 — ABNORMAL HIGH (ref 1.2–2.2)
Albumin: 4.5 g/dL (ref 4.1–5.1)
Alkaline Phosphatase: 47 IU/L (ref 44–121)
BUN/Creatinine Ratio: 12 (ref 9–20)
BUN: 17 mg/dL (ref 6–24)
Bilirubin Total: 0.9 mg/dL (ref 0.0–1.2)
CO2: 24 mmol/L (ref 20–29)
Calcium: 9.3 mg/dL (ref 8.7–10.2)
Chloride: 102 mmol/L (ref 96–106)
Creatinine, Ser: 1.41 mg/dL — ABNORMAL HIGH (ref 0.76–1.27)
Globulin, Total: 1.9 g/dL (ref 1.5–4.5)
Glucose: 103 mg/dL — ABNORMAL HIGH (ref 70–99)
Potassium: 5 mmol/L (ref 3.5–5.2)
Sodium: 140 mmol/L (ref 134–144)
Total Protein: 6.4 g/dL (ref 6.0–8.5)
eGFR: 63 mL/min/{1.73_m2} (ref >59)

## 2022-01-17 LAB — TSH: TSH: 1.04 u[IU]/mL (ref 0.450–4.500)

## 2022-01-29 ENCOUNTER — Other Ambulatory Visit: Payer: Self-pay

## 2022-01-29 DIAGNOSIS — E349 Endocrine disorder, unspecified: Secondary | ICD-10-CM

## 2022-01-29 MED ORDER — TESTOSTERONE CYPIONATE 200 MG/ML IM SOLN: 100.0000 mg | INTRAMUSCULAR | 0 refills | Status: DC

## 2022-01-29 NOTE — Telephone Encounter (Signed)
From: [name] To: Office of [place], [place] Sent: 01/26/2022 3:05 PM EST Subject: Medication Renewal Request  Refills have been requested for the following medications:   testosterone cypionate (DEPOTESTOSTERONE CYPIONATE) 200 MG/ML injection [SHANNON MCGOWAN]  Preferred pharmacy: [facility] #[id number] - [place] [address], [place] AT [facility] OF [address] [facility] Delivery method: [employer]

## 2022-01-29 NOTE — Telephone Encounter (Signed)
Lab appt and OV appt have been made for January. Pt confirmed.

## 2022-03-14 ENCOUNTER — Other Ambulatory Visit: Payer: Self-pay

## 2022-03-14 DIAGNOSIS — R972 Elevated prostate specific antigen [PSA]: Secondary | ICD-10-CM

## 2022-03-14 DIAGNOSIS — E349 Endocrine disorder, unspecified: Secondary | ICD-10-CM

## 2022-03-20 ENCOUNTER — Other Ambulatory Visit: Payer: BC Managed Care – PPO

## 2022-03-20 DIAGNOSIS — R972 Elevated prostate specific antigen [PSA]: Secondary | ICD-10-CM | POA: Diagnosis not present

## 2022-03-20 DIAGNOSIS — E349 Endocrine disorder, unspecified: Secondary | ICD-10-CM | POA: Diagnosis not present

## 2022-03-21 LAB — HEMOGLOBIN AND HEMATOCRIT, BLOOD
Hematocrit: 50.5 % (ref 37.5–51.0)
Hemoglobin: 16.9 g/dL (ref 13.0–17.7)

## 2022-03-21 LAB — TESTOSTERONE: Testosterone: 1202 ng/dL — ABNORMAL HIGH (ref 264–916)

## 2022-03-21 LAB — PSA: Prostate Specific Ag, Serum: 1.4 ng/mL (ref 0.0–4.0)

## 2022-03-21 NOTE — Progress Notes (Unsigned)
03/22/2022 9:07 AM   David Aguirre 30-Jan-1976 [id number]  Referring provider: [name], MD [address] [place],  [place] [address]  Urological history: 1.  Testosterone deficiency -Contributing factors of age and obesity -Testosterone level (03/20/2022) 1202 -Hemoglobin/hematocrit (03/20/2022) 16.9/50.5 -Testosterone cypionate 200 mg/mL, 0.5 cc every 7 days  2. BPH with LU TS -PSA (03/20/2022) 1.4 -I PSS ***  No chief complaint on file.   HPI: David Aguirre is a 47 y.o. male who presents today for three month follow up.      Score:  1-7 Mild 8-19 Moderate 20-35 Severe       Score: 1-7 Severe ED 8-11 Moderate ED 12-16 Mild-Moderate ED 17-21 Mild ED 22-25 No ED      PMH: Past Medical History:  Diagnosis Date   Low testosterone     Surgical History: Past Surgical History:  Procedure Laterality Date   CHOLECYSTECTOMY N/A 11/28/2014   Procedure: LAPAROSCOPIC CHOLECYSTECTOMY WITH INTRAOPERATIVE CHOLANGIOGRAM;  Surgeon: Florene Glen, MD;  Location: [facility] ORS;  Service: General;  Laterality: N/A;   COLONOSCOPY WITH PROPOFOL N/A 10/06/2021   Procedure: COLONOSCOPY WITH PROPOFOL;  Surgeon: [name], MD;  Location: [facility] ENDOSCOPY;  Service: Endoscopy;  Laterality: N/A;   NASAL SEPTUM SURGERY     NASAL SINUS SURGERY     TONSILLECTOMY     TURBINATE REDUCTION Bilateral     Home Medications:  Allergies as of 03/22/2022   No Known Allergies      Medication List        Accurate as of March 21, 2022  9:07 AM. If you have any questions, ask your nurse or doctor.          finasteride 5 MG tablet Commonly known as: PROSCAR Take 5 mg by mouth daily.   MULTIPLE VITAMIN PO Take by mouth.   naproxen 500 MG tablet Commonly known as: NAPROSYN Take 1 tablet (500 mg total) by mouth 2 (two) times daily with a meal.   phentermine 37.5 MG capsule Take 1 capsule (37.5 mg total) by mouth every morning.    testosterone cypionate 200 MG/ML injection Commonly known as: DEPOTESTOSTERONE CYPIONATE Inject 0.5 mLs (100 mg total) into the muscle every 7 (seven) days.   triamcinolone cream 0.1 % Commonly known as: KENALOG Daily at bedtime for 2 weeks        Allergies: No Known Allergies  Family History: Family History  Problem Relation Age of Onset   Diabetes Mother    Hyperlipidemia Mother    Hyperlipidemia Father    Prostate cancer Paternal Grandfather     Social History:  reports that he quit smoking about 21 years ago. His smoking use included cigarettes. He smoked an average of 1 pack per day. His smokeless tobacco use includes snuff. He reports current alcohol use. He reports that he does not use drugs.  ROS: Pertinent ROS in HPI  Physical Exam: There were no vitals taken for this visit.  Constitutional:  Well nourished. Alert and oriented, No acute distress. HEENT: [place] AT, moist mucus membranes.  Trachea midline Cardiovascular: No clubbing, cyanosis, or edema. Respiratory: Normal respiratory effort, no increased work of breathing. GU: No CVA tenderness.  No bladder fullness or masses.  Patient with circumcised/uncircumcised phallus. ***Foreskin easily retracted***  Urethral meatus is patent.  No penile discharge. No penile lesions or rashes. Scrotum without lesions, cysts, rashes and/or edema.  Testicles are located scrotally bilaterally. No masses are appreciated in the testicles. Left and right  epididymis are normal. Rectal: Patient with  normal sphincter tone. Anus and perineum without scarring or rashes. No rectal masses are appreciated. Prostate is approximately *** grams, *** nodules are appreciated. Seminal vesicles are normal. Neurologic: Grossly intact, no focal deficits, moving all 4 extremities. Psychiatric: Normal mood and affect.   Laboratory Data: Lab Results  Component Value Date   CREATININE 1.41 (H) 01/16/2022   Lab Results  Component Value Date    TESTOSTERONE 1,202 (H) 03/20/2022   Lab Results  Component Value Date   TSH 1.040 01/16/2022      Component Value Date/Time   CHOL 165 04/21/2021 0848   HDL 36 (L) 04/21/2021 0848   CHOLHDL 4.6 04/21/2021 0848   LDLCALC 113 (H) 04/21/2021 0848   Lab Results  Component Value Date   AST 28 01/16/2022   Lab Results  Component Value Date   ALT 29 01/16/2022   Results for orders placed or performed in visit on 03/20/22  PSA  Result Value Ref Range   Prostate Specific Ag, Serum 1.4 0.0 - 4.0 ng/mL  Hemoglobin and Hematocrit, Blood  Result Value Ref Range   Hemoglobin 16.9 13.0 - 17.7 g/dL   Hematocrit 50.5 37.5 - 51.0 %  Testosterone  Result Value Ref Range   Testosterone 1,202 (H) 264 - 916 ng/dL  I have reviewed the labs.   Pertinent Imaging: N/A     Assessment & Plan:    1. Testosterone deficiency  -testosterone levels are therapeutic -H & H WNL -continue testosterone cypionate 200 mg/milliliters, 0.5 cc every 7 days (injecting on Sundays)  2. BPH with LUTS -PSA stable -symptoms -occasional weak urinary stream -continue conservative management, avoiding bladder irritants and timed voiding's  3. Erectile dysfunction:    -Responding well with testosterone treatment     No follow-ups on file.  These notes generated with voice recognition software. I apologize for typographical errors.  [place], [place] [address]  [place], [place] [address] [phone]

## 2022-03-22 ENCOUNTER — Ambulatory Visit: Payer: BC Managed Care – PPO | Admitting: Urology

## 2022-03-22 ENCOUNTER — Encounter: Payer: Self-pay | Admitting: Urology

## 2022-03-22 VITALS — BP 138/89 | HR 72 | Ht 72.0 in | Wt 263.4 lb

## 2022-03-22 DIAGNOSIS — E291 Testicular hypofunction: Secondary | ICD-10-CM

## 2022-03-22 DIAGNOSIS — N529 Male erectile dysfunction, unspecified: Secondary | ICD-10-CM | POA: Diagnosis not present

## 2022-03-22 DIAGNOSIS — E349 Endocrine disorder, unspecified: Secondary | ICD-10-CM

## 2022-03-22 DIAGNOSIS — N401 Enlarged prostate with lower urinary tract symptoms: Secondary | ICD-10-CM

## 2022-03-22 DIAGNOSIS — N138 Other obstructive and reflux uropathy: Secondary | ICD-10-CM

## 2022-03-22 MED ORDER — SYRINGE 2-3 ML 3 ML MISC: 1.0000 mg | 3 refills | Status: DC

## 2022-03-22 MED ORDER — TESTOSTERONE CYPIONATE 200 MG/ML IM SOLN: 100.0000 mg | INTRAMUSCULAR | 0 refills | Status: DC

## 2022-08-08 ENCOUNTER — Other Ambulatory Visit: Payer: Self-pay | Admitting: Urology

## 2022-08-08 DIAGNOSIS — E349 Endocrine disorder, unspecified: Secondary | ICD-10-CM

## 2022-08-08 MED ORDER — TESTOSTERONE CYPIONATE 200 MG/ML IM SOLN: 100.0000 mg | INTRAMUSCULAR | 0 refills | Status: DC

## 2022-08-08 NOTE — Telephone Encounter (Signed)
From: David Aguirre To: Office of [place], [place] Sent: 08/07/2022 5:40 PM EDT Subject: Medication Renewal Request  Refills have been requested for the following medications:   testosterone cypionate (DEPOTESTOSTERONE CYPIONATE) 200 MG/ML injection [David Aguirre]  Patient Comment: Ran out of medication at 20 weeks. Office visit/bloodwork not till july  Preferred pharmacy: [facility] #[id number] - [place], [place] - [address] AT [facility] OF [address] [facility] Delivery method: [employer]

## 2022-09-19 ENCOUNTER — Other Ambulatory Visit: Payer: BC Managed Care – PPO

## 2022-09-19 DIAGNOSIS — E291 Testicular hypofunction: Secondary | ICD-10-CM

## 2022-09-19 DIAGNOSIS — N138 Other obstructive and reflux uropathy: Secondary | ICD-10-CM | POA: Diagnosis not present

## 2022-09-19 DIAGNOSIS — N401 Enlarged prostate with lower urinary tract symptoms: Secondary | ICD-10-CM | POA: Diagnosis not present

## 2022-09-20 LAB — HEMOGLOBIN AND HEMATOCRIT, BLOOD
Hematocrit: 52.7 % — ABNORMAL HIGH (ref 37.5–51.0)
Hemoglobin: 17.8 g/dL — ABNORMAL HIGH (ref 13.0–17.7)

## 2022-09-20 LAB — TESTOSTERONE: Testosterone: 790 ng/dL (ref 264–916)

## 2022-09-20 LAB — PSA: Prostate Specific Ag, Serum: 1.6 ng/mL (ref 0.0–4.0)

## 2022-09-20 NOTE — Progress Notes (Signed)
09/21/2022 8:37 AM   David Aguirre March 26, 1975 [id number]  Referring provider: Ronnald Ramp, MD [address] [place],  [place] [address]  Urological history: 1.  Testosterone deficiency -Contributing factors of age and obesity -Testosterone level (08/2022) 790 -Hemoglobin/hematocrit (08/2022) 17.8/52.7 -Testosterone cypionate 200 mg/mL, 0.5 cc every 7 days (Sundays)  2. BPH with LU TS -PSA (08/2022) 1.6 (3.2)  -paternal grandfather w/ prostate cancer -finasteride 5 mg daily -actually taking it for hair loss  Chief Complaint  Patient presents with   Benign Prostatic Hypertrophy    HPI: David Aguirre is a 47 y.o. male who presents today for three month follow up.  Previous records reviewed.   I PSS  8/2  He has some urinary frequency which he attributes to his coffee and water intake.  Patient denies any modifying or aggravating factors.  Patient denies any recent UTI's, gross hematuria, dysuria or suprapubic/flank pain.  Patient denies any fevers, chills, nausea or vomiting.    IPSS     Row Name 09/21/22 0800         International Prostate Symptom Score   How often have you had the sensation of not emptying your bladder? Less than 1 in 5     How often have you had to urinate less than every two hours? More than half the time     How often have you found you stopped and started again several times when you urinated? Not at All     How often have you found it difficult to postpone urination? Not at All     How often have you had a weak urinary stream? Less than 1 in 5 times     How often have you had to strain to start urination? Not at All     How many times did you typically get up at night to urinate? 2 Times     Total IPSS Score 8       Quality of Life due to urinary symptoms   If you were to spend the rest of your life with your urinary condition just the way it is now how would you feel about that? Mostly Satisfied               IPSS     Row Name 09/21/22 0800         International Prostate Symptom Score   How often have you had the sensation of not emptying your bladder? Less than 1 in 5     How often have you had to urinate less than every two hours? More than half the time     How often have you found you stopped and started again several times when you urinated? Not at All     How often have you found it difficult to postpone urination? Not at All     How often have you had a weak urinary stream? Less than 1 in 5 times     How often have you had to strain to start urination? Not at All     How many times did you typically get up at night to urinate? 2 Times     Total IPSS Score 8       Quality of Life due to urinary symptoms   If you were to spend the rest of your life with your urinary condition just the way it is now how would you feel about that? Mostly Satisfied  Score:  1-7 Mild 8-19 Moderate 20-35 Severe   SHIM 20  Patient is not having spontaneous erections.  He denies any pain or curvature with erections.  His erections are adequate for intercourse.    SHIM     Row Name 09/21/22 [phone]         SHIM: Over the last 6 months:   How do you rate your confidence that you could get and keep an erection? Very High     When you had erections with sexual stimulation, how often were your erections hard enough for penetration (entering your partner)? Almost Always or Always     During sexual intercourse, how often were you able to maintain your erection after you had penetrated (entered) your partner? Almost Always or Always     During sexual intercourse, how difficult was it to maintain your erection to completion of intercourse? Not Difficult     When you attempted sexual intercourse, how often was it satisfactory for you? Almost Always or Always       SHIM Total Score   SHIM 25              SHIM     Row Name 09/21/22 0814         SHIM: Over the last 6  months:   How do you rate your confidence that you could get and keep an erection? Very High     When you had erections with sexual stimulation, how often were your erections hard enough for penetration (entering your partner)? Almost Always or Always     During sexual intercourse, how often were you able to maintain your erection after you had penetrated (entered) your partner? Almost Always or Always     During sexual intercourse, how difficult was it to maintain your erection to completion of intercourse? Not Difficult     When you attempted sexual intercourse, how often was it satisfactory for you? Almost Always or Always       SHIM Total Score   SHIM 25             Score: 1-7 Severe ED 8-11 Moderate ED 12-16 Mild-Moderate ED 17-21 Mild ED 22-25 No ED      PMH: Past Medical History:  Diagnosis Date   Low testosterone     Surgical History: Past Surgical History:  Procedure Laterality Date   CHOLECYSTECTOMY N/A 11/28/2014   Procedure: LAPAROSCOPIC CHOLECYSTECTOMY WITH INTRAOPERATIVE CHOLANGIOGRAM;  Surgeon: Lattie Haw, MD;  Location: [facility] ORS;  Service: General;  Laterality: N/A;   COLONOSCOPY WITH PROPOFOL N/A 10/06/2021   Procedure: COLONOSCOPY WITH PROPOFOL;  Surgeon: Midge Minium, MD;  Location: [facility] ENDOSCOPY;  Service: Endoscopy;  Laterality: N/A;   NASAL SEPTUM SURGERY     NASAL SINUS SURGERY     TONSILLECTOMY     TURBINATE REDUCTION Bilateral     Home Medications:  Allergies as of 09/21/2022   No Known Allergies      Medication List        Accurate as of September 21, 2022  8:37 AM. If you have any questions, ask your nurse or doctor.          2-3CC SYRINGE 3 ML Misc 1 mg by Does not apply route every 14 (fourteen) days.   finasteride 5 MG tablet Commonly known as: PROSCAR Take 5 mg by mouth daily.   MULTIPLE VITAMIN PO Take by mouth.   naproxen 500 MG tablet Commonly known as: NAPROSYN Take 1 tablet (500  mg total) by mouth 2 (two) times  daily with a meal.   phentermine 37.5 MG capsule Take 1 capsule (37.5 mg total) by mouth every morning.   testosterone cypionate 200 MG/ML injection Commonly known as: DEPOTESTOSTERONE CYPIONATE Inject 0.5 mLs (100 mg total) into the muscle every 7 (seven) days.   triamcinolone cream 0.1 % Commonly known as: KENALOG Daily at bedtime for 2 weeks        Allergies: No Known Allergies  Family History: Family History  Problem Relation Age of Onset   Diabetes Mother    Hyperlipidemia Mother    Hyperlipidemia Father    Prostate cancer Paternal Grandfather     Social History:  reports that he quit smoking about 21 years ago. His smoking use included cigarettes. His smokeless tobacco use includes snuff. He reports current alcohol use. He reports that he does not use drugs.  ROS: Pertinent ROS in HPI  Physical Exam: BP 135/80   Pulse 69   Ht 6' (1.829 m)   Wt 265 lb (120.2 kg)   BMI 35.94 kg/m   Constitutional:  Well nourished. Alert and oriented, No acute distress. HEENT: [place] AT, moist mucus membranes.  Trachea midline Cardiovascular: No clubbing, cyanosis, or edema. Respiratory: Normal respiratory effort, no increased work of breathing. Neurologic: Grossly intact, no focal deficits, moving all 4 extremities. Psychiatric: Normal mood and affect.   Laboratory Data: Results for orders placed or performed in visit on 09/19/22  PSA  Result Value Ref Range   Prostate Specific Ag, Serum 1.6 0.0 - 4.0 ng/mL  Testosterone  Result Value Ref Range   Testosterone 790 264 - 916 ng/dL  Hemoglobin and Hematocrit, Blood  Result Value Ref Range   Hemoglobin 17.8 (H) 13.0 - 17.7 g/dL   Hematocrit 54.0 (H) 98.1 - 51.0 %  I have reviewed the labs.   Pertinent Imaging: N/A     Assessment & Plan:    1. Testosterone deficiency  -testosterone levels are therapeutic -H & H WNL -testosterone cypionate 200 mg/milliliters, 0.5 cc every 7 days   2. BPH with LUTS -PSA  stable -symptoms - frequency -continue conservative management, avoiding bladder irritants and timed voiding's  3. Erectile dysfunction:    -Responding well with testosterone treatment  Return in about 6 months (around 03/24/2023) for PSA, testosterone (one week after injection) H & H.  These notes generated with voice recognition software. I apologize for typographical errors.  Cloretta Ned  [facility] [address]  [address] [place], [place] [address] [phone]

## 2022-09-21 ENCOUNTER — Ambulatory Visit: Payer: BC Managed Care – PPO | Admitting: Urology

## 2022-09-21 ENCOUNTER — Encounter: Payer: Self-pay | Admitting: Urology

## 2022-09-21 VITALS — BP 135/80 | HR 69 | Ht 72.0 in | Wt 265.0 lb

## 2022-09-21 DIAGNOSIS — N401 Enlarged prostate with lower urinary tract symptoms: Secondary | ICD-10-CM | POA: Diagnosis not present

## 2022-09-21 DIAGNOSIS — E349 Endocrine disorder, unspecified: Secondary | ICD-10-CM

## 2022-09-21 DIAGNOSIS — N529 Male erectile dysfunction, unspecified: Secondary | ICD-10-CM

## 2022-09-21 DIAGNOSIS — E291 Testicular hypofunction: Secondary | ICD-10-CM | POA: Diagnosis not present

## 2022-09-21 MED ORDER — BD SAFETYGLIDE NEEDLE 18G X 1-1/2" MISC
0 refills | Status: DC
Start: 2022-09-21 — End: 2023-06-07

## 2022-09-21 MED ORDER — "SYRINGE/NEEDLE (DISP) 21G X 1-1/2"" 3 ML MISC": 0 refills | Status: DC

## 2022-09-21 MED ORDER — TESTOSTERONE CYPIONATE 200 MG/ML IM SOLN: 100.0000 mg | INTRAMUSCULAR | 0 refills | Status: DC

## 2022-09-21 MED ORDER — SYRINGE 2-3 ML 3 ML MISC: 1.0000 mg | 3 refills | Status: DC

## 2022-09-21 NOTE — Addendum Note (Signed)
Addended by: Sueanne Margarita on: 09/21/2022 03:17 PM   Modules accepted: Orders

## 2022-11-01 ENCOUNTER — Other Ambulatory Visit: Payer: Self-pay | Admitting: Urology

## 2022-11-01 DIAGNOSIS — R718 Other abnormality of red blood cells: Secondary | ICD-10-CM

## 2022-11-06 ENCOUNTER — Inpatient Hospital Stay: Payer: BC Managed Care – PPO | Attending: Internal Medicine | Admitting: Internal Medicine

## 2022-11-06 ENCOUNTER — Encounter: Payer: Self-pay | Admitting: Internal Medicine

## 2022-11-06 ENCOUNTER — Inpatient Hospital Stay: Payer: BC Managed Care – PPO

## 2022-11-06 ENCOUNTER — Encounter: Payer: Self-pay | Admitting: *Deleted

## 2022-11-06 VITALS — BP 128/82 | HR 80 | Temp 97.0°F | Ht 72.0 in | Wt 267.2 lb

## 2022-11-06 DIAGNOSIS — D751 Secondary polycythemia: Secondary | ICD-10-CM

## 2022-11-06 DIAGNOSIS — Z87891 Personal history of nicotine dependence: Secondary | ICD-10-CM | POA: Insufficient documentation

## 2022-11-06 LAB — CBC WITH DIFFERENTIAL/PLATELET
Abs Immature Granulocytes: 0.02 10*3/uL (ref 0.00–0.07)
Basophils Absolute: 0.1 10*3/uL (ref 0.0–0.1)
Basophils Relative: 1 %
Eosinophils Absolute: 0.2 10*3/uL (ref 0.0–0.5)
Eosinophils Relative: 2 %
HCT: 50.2 % (ref 39.0–52.0)
Hemoglobin: 16.5 g/dL (ref 13.0–17.0)
Immature Granulocytes: 0 %
Lymphocytes Relative: 32 %
Lymphs Abs: 2.5 10*3/uL (ref 0.7–4.0)
MCH: 29.3 pg (ref 26.0–34.0)
MCHC: 32.9 g/dL (ref 30.0–36.0)
MCV: 89.2 fL (ref 80.0–100.0)
Monocytes Absolute: 0.7 10*3/uL (ref 0.1–1.0)
Monocytes Relative: 9 %
Neutro Abs: 4.5 10*3/uL (ref 1.7–7.7)
Neutrophils Relative %: 56 %
Platelets: 236 10*3/uL (ref 150–400)
RBC: 5.63 MIL/uL (ref 4.22–5.81)
RDW: 13.7 % (ref 11.5–15.5)
WBC: 7.9 10*3/uL (ref 4.0–10.5)
nRBC: 0 % (ref 0.0–0.2)

## 2022-11-06 LAB — COMPREHENSIVE METABOLIC PANEL
ALT: 20 U/L (ref 0–44)
AST: 21 U/L (ref 15–41)
Albumin: 4.5 g/dL (ref 3.5–5.0)
Alkaline Phosphatase: 33 U/L — ABNORMAL LOW (ref 38–126)
Anion gap: 8 (ref 5–15)
BUN: 27 mg/dL — ABNORMAL HIGH (ref 6–20)
CO2: 26 mmol/L (ref 22–32)
Calcium: 9.3 mg/dL (ref 8.9–10.3)
Chloride: 101 mmol/L (ref 98–111)
Creatinine, Ser: 1.36 mg/dL — ABNORMAL HIGH (ref 0.61–1.24)
GFR, Estimated: >60 mL/min (ref >60)
Glucose, Bld: 98 mg/dL (ref 70–99)
Potassium: 4.2 mmol/L (ref 3.5–5.1)
Sodium: 135 mmol/L (ref 135–145)
Total Bilirubin: 0.9 mg/dL (ref 0.3–1.2)
Total Protein: 7.1 g/dL (ref 6.5–8.1)

## 2022-11-06 NOTE — Progress Notes (Signed)
No question/concerns today.

## 2022-11-06 NOTE — Assessment & Plan Note (Addendum)
#  Erythrocytosis- [HCT 51.7- July 2024]. Currently patient is asymptomatic.  Likely from testosterone supplementation.  However also rule out primary causes.  For now I recommend checking CBC;CMP jak 2; LDH; erythropoietin levels.  HOLD OFF bone marrow at this time.  # Recommend phlebotomy if hematocrit greater than 52-to cut down the risk of stroke/blood clots [although the risk is lower compared to primary/polycythemia vera].  Thank you, Ms.McGowan for allowing me to participate in the care of your pleasant patient. Please do not hesitate to contact me with questions or concerns in the interim.   # DISPOSITION: # labs- ordered # Phlebotomy  next week # in 3 months- H&H- possible phlebotomy  # follow up in 6  months- MD: H&H- possible phlebotomy s- Dr.B  Addendum: Patient's hemoglobin today is-16.5 hematocrit 50.  Recommend holding of phlebotomy at this time.  Otherwise-Labs in 3 months; and follow-up as planned. Pt will be informed. GB

## 2022-11-06 NOTE — Progress Notes (Signed)
[facility] CONSULT NOTE  Patient Care Team: Ronnald Ramp, MD as PCP - General (Family Medicine) Earna Coder, MD as Consulting Physician (Oncology)  CHIEF COMPLAINTS/PURPOSE OF CONSULTATION: ERYTHROCYTOSIS   HEMATOLOGY HISTORY  # ERYTHROCYTOSIS  [Hb; WBC; platelets]  HISTORY OF PRESENTING ILLNESS:  David Aguirre 47 y.o.  male pleasant patient was been referred to [place] for further evaluation of erythrocytosis incidentally noted on routine blood work.  Patient denies any pulsating headaches.  Denies any worsening fatigue.   Smoking: never  Lung Disease/COPD: none OSA/CPAP: snoring; but no diagnosis.  Hx of DVT/PE or Stroke: none.  Testosterone: injections for last 1 year [[place]] Diuretics: none    Review of Systems  Constitutional:  Negative for chills, diaphoresis, fever, malaise/fatigue and weight loss.  HENT:  Negative for nosebleeds and sore throat.   Eyes:  Negative for double vision.  Respiratory:  Negative for cough, hemoptysis, sputum production, shortness of breath and wheezing.   Cardiovascular:  Negative for chest pain, palpitations, orthopnea and leg swelling.  Gastrointestinal:  Negative for abdominal pain, blood in stool, constipation, diarrhea, heartburn, melena, nausea and vomiting.  Genitourinary:  Negative for dysuria, frequency and urgency.  Musculoskeletal:  Negative for back pain and joint pain.  Skin: Negative.  Negative for itching and rash.  Neurological:  Negative for dizziness, tingling, focal weakness, weakness and headaches.  Endo/Heme/Allergies:  Does not bruise/bleed easily.  Psychiatric/Behavioral:  Negative for depression. The patient is not nervous/anxious and does not have insomnia.      MEDICAL HISTORY:  Past Medical History:  Diagnosis Date   Low testosterone     SURGICAL HISTORY: Past Surgical History:  Procedure Laterality Date   CHOLECYSTECTOMY N/A 11/28/2014   Procedure: LAPAROSCOPIC  CHOLECYSTECTOMY WITH INTRAOPERATIVE CHOLANGIOGRAM;  Surgeon: Lattie Haw, MD;  Location: [facility] ORS;  Service: General;  Laterality: N/A;   COLONOSCOPY WITH PROPOFOL N/A 10/06/2021   Procedure: COLONOSCOPY WITH PROPOFOL;  Surgeon: Midge Minium, MD;  Location: [facility] ENDOSCOPY;  Service: Endoscopy;  Laterality: N/A;   NASAL SEPTUM SURGERY     NASAL SINUS SURGERY     TONSILLECTOMY     TURBINATE REDUCTION Bilateral     SOCIAL HISTORY: Social History   Socioeconomic History   Marital status: Married    Spouse name: Not on file   Number of children: Not on file   Years of education: Not on file   Highest education level: Not on file  Occupational History   Not on file  Tobacco Use   Smoking status: Former    Current packs/day: 0.00    Types: Cigarettes    Quit date: 2003    Years since quitting: 21.7   Smokeless tobacco: Current    Types: Snuff  Vaping Use   Vaping status: Never Used  Substance and Sexual Activity   Alcohol use: Yes    Comment: 2-3 times a week   Drug use: No   Sexual activity: Not on file  Other Topics Concern   Not on file  Social History Narrative   Not on file   Social Determinants of Health   Financial Resource Strain: Not on file  Food Insecurity: No Food Insecurity (11/06/2022)   Hunger Vital Sign    Worried About Running Out of Food in the Last Year: Never true    Ran Out of Food in the Last Year: Never true  Transportation Needs: No Transportation Needs (11/06/2022)   PRAPARE - Administrator, Civil Service (Medical):  No    Lack of Transportation (Non-Medical): No  Physical Activity: Not on file  Stress: Not on file  Social Connections: Not on file  Intimate Partner Violence: Not At Risk (11/06/2022)   Humiliation, Afraid, Rape, and Kick questionnaire    Fear of Current or Ex-Partner: No    Emotionally Abused: No    Physically Abused: No    Sexually Abused: No    FAMILY HISTORY: Family History  Problem Relation Age of Onset    Diabetes Mother    Hyperlipidemia Mother    Hyperlipidemia Father    Prostate cancer Father    Prostate cancer Paternal Grandfather     ALLERGIES:  has No Known Allergies.  MEDICATIONS:  Current Outpatient Medications  Medication Sig Dispense Refill   finasteride (PROSCAR) 5 MG tablet Take 5 mg by mouth daily.     MULTIPLE VITAMIN PO Take by mouth.     naproxen (NAPROSYN) 500 MG tablet Take 1 tablet (500 mg total) by mouth 2 (two) times daily with a meal. 60 tablet 2   NEEDLE, DISP, 18 G (BD SAFETYGLIDE NEEDLE) 18G X 1-1/2" MISC Use to draw-up testosterone then change to 21g needle for injection. 30 each 0   SYRINGE-NEEDLE, DISP, 3 ML 21G X 1-1/2" 3 ML MISC Use to inject testosterone every 14 days 30 each 0   testosterone cypionate (DEPOTESTOSTERONE CYPIONATE) 200 MG/ML injection Inject 0.5 mLs (100 mg total) into the muscle every 7 (seven) days. 10 mL 0   triamcinolone cream (KENALOG) 0.1 % Daily at bedtime for 2 weeks 15 g 1   phentermine 37.5 MG capsule Take 1 capsule (37.5 mg total) by mouth every morning. (Patient not taking: Reported on 11/06/2022) 30 capsule 3   No current facility-administered medications for this visit.     PHYSICAL EXAMINATION:   Vitals:   11/06/22 1357  BP: 128/82  Pulse: 80  Temp: (!) 97 F (36.1 C)  SpO2: 98%   Filed Weights   11/06/22 1357  Weight: 267 lb 3.2 oz (121.2 kg)    Physical Exam Vitals and nursing note reviewed.  HENT:     Head: Normocephalic and atraumatic.     Mouth/Throat:     Pharynx: Oropharynx is clear.  Eyes:     Extraocular Movements: Extraocular movements intact.     Pupils: Pupils are equal, round, and reactive to light.  Cardiovascular:     Rate and Rhythm: Normal rate and regular rhythm.  Pulmonary:     Comments: Decreased breath sounds bilaterally.  Abdominal:     Palpations: Abdomen is soft.  Musculoskeletal:        General: Normal range of motion.     Cervical back: Normal range of motion.  Skin:     General: Skin is warm.  Neurological:     General: No focal deficit present.     Mental Status: He is alert and oriented to person, place, and time.  Psychiatric:        Behavior: Behavior normal.        Judgment: Judgment normal.      LABORATORY DATA:  I have reviewed the data as listed Lab Results  Component Value Date   WBC 7.9 11/06/2022   HGB 16.5 11/06/2022   HCT 50.2 11/06/2022   MCV 89.2 11/06/2022   PLT 236 11/06/2022   Recent Labs    01/16/22 0842  NA 140  K 5.0  CL 102  CO2 24  GLUCOSE 103*  BUN 17  CREATININE  1.41*  CALCIUM 9.3  PROT 6.4  ALBUMIN 4.5  AST 28  ALT 29  ALKPHOS 47  BILITOT 0.9     No results found.  ASSESSMENT & PLAN:   Erythrocytosis #Erythrocytosis- [HCT 51.7- July 2024]. Currently patient is asymptomatic.  Likely from testosterone supplementation.  However also rule out primary causes.  For now I recommend checking CBC;CMP jak 2; LDH; erythropoietin levels.  HOLD OFF bone marrow at this time.  # Recommend phlebotomy if hematocrit greater than 52-to cut down the risk of stroke/blood clots [although the risk is lower compared to primary/polycythemia vera].  Thank you, Ms.[place] for allowing me to participate in the care of your pleasant patient. Please do not hesitate to contact me with questions or concerns in the interim.   # DISPOSITION: # labs- ordered # Phlebotomy  next week # in 3 months- H&H- possible phlebotomy  # follow up in 6  months- MD: H&H- possible phlebotomy s- Dr.B  Addendum: Patient's hemoglobin today is-16.5 hematocrit 50.  Recommend holding of phlebotomy at this time.  Otherwise-Labs in 3 months; and follow-up as planned. Pt will be informed. GB     Earna Coder, MD 11/06/2022 2:57 PM

## 2022-11-07 LAB — ERYTHROPOIETIN: Erythropoietin: 14.8 m[IU]/mL (ref 2.6–18.5)

## 2022-11-28 DIAGNOSIS — Z86018 Personal history of other benign neoplasm: Secondary | ICD-10-CM | POA: Diagnosis not present

## 2022-11-28 DIAGNOSIS — L648 Other androgenic alopecia: Secondary | ICD-10-CM | POA: Diagnosis not present

## 2022-11-28 DIAGNOSIS — D1801 Hemangioma of skin and subcutaneous tissue: Secondary | ICD-10-CM | POA: Diagnosis not present

## 2022-11-28 DIAGNOSIS — L578 Other skin changes due to chronic exposure to nonionizing radiation: Secondary | ICD-10-CM | POA: Diagnosis not present

## 2022-12-27 DIAGNOSIS — D229 Melanocytic nevi, unspecified: Secondary | ICD-10-CM | POA: Diagnosis not present

## 2022-12-27 DIAGNOSIS — R0683 Snoring: Secondary | ICD-10-CM | POA: Diagnosis not present

## 2022-12-27 DIAGNOSIS — Z0000 Encounter for general adult medical examination without abnormal findings: Secondary | ICD-10-CM | POA: Diagnosis not present

## 2022-12-27 DIAGNOSIS — Z1389 Encounter for screening for other disorder: Secondary | ICD-10-CM | POA: Diagnosis not present

## 2022-12-27 DIAGNOSIS — E7800 Pure hypercholesterolemia, unspecified: Secondary | ICD-10-CM | POA: Diagnosis not present

## 2023-02-06 ENCOUNTER — Telehealth: Payer: Self-pay | Admitting: Internal Medicine

## 2023-02-06 NOTE — Telephone Encounter (Signed)
Patient called to see if he needed to come for phlebotomy Friday. He said he recalled Dr. B telling him he didn't need to. Please advise.

## 2023-02-08 ENCOUNTER — Inpatient Hospital Stay: Payer: BC Managed Care – PPO

## 2023-02-24 ENCOUNTER — Other Ambulatory Visit: Payer: Self-pay | Admitting: Urology

## 2023-02-24 DIAGNOSIS — E349 Endocrine disorder, unspecified: Secondary | ICD-10-CM

## 2023-02-24 MED ORDER — TESTOSTERONE CYPIONATE 200 MG/ML IM SOLN: 100.0000 mg | INTRAMUSCULAR | 0 refills | Status: DC

## 2023-03-04 ENCOUNTER — Telehealth: Payer: Self-pay | Admitting: *Deleted

## 2023-03-04 NOTE — Telephone Encounter (Signed)
 Called spoke with patient to advised testosterone has been approved 03/01/2023-02/29/2024 reference number [id number]

## 2023-03-08 DIAGNOSIS — M9901 Segmental and somatic dysfunction of cervical region: Secondary | ICD-10-CM | POA: Diagnosis not present

## 2023-03-08 DIAGNOSIS — M542 Cervicalgia: Secondary | ICD-10-CM | POA: Diagnosis not present

## 2023-03-08 DIAGNOSIS — M9902 Segmental and somatic dysfunction of thoracic region: Secondary | ICD-10-CM | POA: Diagnosis not present

## 2023-03-08 DIAGNOSIS — M62830 Muscle spasm of back: Secondary | ICD-10-CM | POA: Diagnosis not present

## 2023-03-12 ENCOUNTER — Other Ambulatory Visit: Payer: BC Managed Care – PPO

## 2023-03-22 ENCOUNTER — Other Ambulatory Visit: Payer: Self-pay | Admitting: *Deleted

## 2023-03-22 DIAGNOSIS — E291 Testicular hypofunction: Secondary | ICD-10-CM

## 2023-03-25 ENCOUNTER — Other Ambulatory Visit: Payer: BC Managed Care – PPO

## 2023-03-25 DIAGNOSIS — E291 Testicular hypofunction: Secondary | ICD-10-CM | POA: Diagnosis not present

## 2023-03-26 ENCOUNTER — Other Ambulatory Visit: Payer: Self-pay | Admitting: *Deleted

## 2023-03-26 DIAGNOSIS — R972 Elevated prostate specific antigen [PSA]: Secondary | ICD-10-CM

## 2023-03-26 LAB — HEMOGLOBIN AND HEMATOCRIT, BLOOD
Hematocrit: 50.5 % (ref 37.5–51.0)
Hemoglobin: 16.9 g/dL (ref 13.0–17.7)

## 2023-03-26 LAB — TESTOSTERONE: Testosterone: 923 ng/dL — ABNORMAL HIGH (ref 264–916)

## 2023-03-27 ENCOUNTER — Other Ambulatory Visit: Payer: BC Managed Care – PPO

## 2023-03-27 DIAGNOSIS — R972 Elevated prostate specific antigen [PSA]: Secondary | ICD-10-CM

## 2023-03-28 LAB — PSA: Prostate Specific Ag, Serum: 1 ng/mL (ref 0.0–4.0)

## 2023-04-02 ENCOUNTER — Ambulatory Visit: Payer: BC Managed Care – PPO | Admitting: Urology

## 2023-04-04 NOTE — Progress Notes (Signed)
04/09/2023 9:01 AM   David Aguirre 07-17-1975 [id number]  Referring provider: Ronnald Ramp, MD [address] [place],  [place] [address]  Urological history: 1.  Hypogonadism -Contributing factors of age and obesity -Testosterone level (02/2023) 923 -Hemoglobin/hematocrit (02/2022) 16.9/50.5 -Testosterone cypionate 200 mg/mL, 0.5 cc every 7 days (Sundays)  2. BPH with LU TS -PSA (02/2023) 1.0 (2.0)  -paternal grandfather w/ prostate cancer -finasteride 5 mg daily -actually taking it for hair loss  Chief Complaint  Patient presents with   Hypogonadism    HPI: David Aguirre is a 48 y.o. male who presents today for follow up.  Previous records reviewed.   I PSS 7/2   IPSS     Row Name 04/09/23 0800         International Prostate Symptom Score   How often have you had the sensation of not emptying your bladder? Less than 1 in 5     How often have you had to urinate less than every two hours? About half the time     How often have you found you stopped and started again several times when you urinated? Less than 1 in 5 times     How often have you found it difficult to postpone urination? Not at All     How often have you had a weak urinary stream? Not at All     How often have you had to strain to start urination? Not at All     How many times did you typically get up at night to urinate? 2 Times     Total IPSS Score 7       Quality of Life due to urinary symptoms   If you were to spend the rest of your life with your urinary condition just the way it is now how would you feel about that? Mostly Satisfied             Score:  1-7 Mild 8-19 Moderate 20-35 Severe   SHIM 25   SHIM     Row Name 04/09/23 0847         SHIM: Over the last 6 months:   How do you rate your confidence that you could get and keep an erection? Very High     When you had erections with sexual stimulation, how often were your erections hard  enough for penetration (entering your partner)? Almost Always or Always     During sexual intercourse, how often were you able to maintain your erection after you had penetrated (entered) your partner? Almost Always or Always     During sexual intercourse, how difficult was it to maintain your erection to completion of intercourse? Not Difficult     When you attempted sexual intercourse, how often was it satisfactory for you? Almost Always or Always       SHIM Total Score   SHIM 25             Score: 1-7 Severe ED 8-11 Moderate ED 12-16 Mild-Moderate ED 17-21 Mild ED 22-25 No ED      PMH: Past Medical History:  Diagnosis Date   Low testosterone     Surgical History: Past Surgical History:  Procedure Laterality Date   CHOLECYSTECTOMY N/A 11/28/2014   Procedure: LAPAROSCOPIC CHOLECYSTECTOMY WITH INTRAOPERATIVE CHOLANGIOGRAM;  Surgeon: Lattie Haw, MD;  Location: [facility] ORS;  Service: General;  Laterality: N/A;   COLONOSCOPY WITH PROPOFOL N/A 10/06/2021   Procedure: COLONOSCOPY WITH PROPOFOL;  Surgeon: Midge Minium, MD;  Location[facility];  Service: Endoscopy;  Laterality: N/A;   NASAL SEPTUM SURGERY     NASAL SINUS SURGERY     TONSILLECTOMY     TURBINATE REDUCTION Bilateral     Home Medications:  Allergies as of 04/09/2023   No Known Allergies      Medication List        Accurate as of April 09, 2023  9:01 AM. If you have any questions, ask your nurse or doctor.          BD SafetyGlide Needle 18G X 1-1/2" Misc Generic drug: NEEDLE (DISP) 18 G Use to draw-up testosterone then change to 21g needle for injection.   finasteride 5 MG tablet Commonly known as: PROSCAR Take 5 mg by mouth daily.   MULTIPLE VITAMIN PO Take by mouth.   naproxen 500 MG tablet Commonly known as: NAPROSYN Take 1 tablet (500 mg total) by mouth 2 (two) times daily with a meal.   phentermine 37.5 MG capsule Take 1 capsule (37.5 mg total) by mouth every morning.    SYRINGE-NEEDLE (DISP) 3 ML 21G X 1-1/2" 3 ML Misc Use to inject testosterone every 14 days   testosterone cypionate 200 MG/ML injection Commonly known as: DEPOTESTOSTERONE CYPIONATE Inject 0.5 mLs (100 mg total) into the muscle every 7 (seven) days.   triamcinolone cream 0.1 % Commonly known as: KENALOG Daily at bedtime for 2 weeks        Allergies: No Known Allergies  Family History: Family History  Problem Relation Age of Onset   Diabetes Mother    Hyperlipidemia Mother    Hyperlipidemia Father    Prostate cancer Father    Prostate cancer Paternal Grandfather     Social History:  reports that he quit smoking about 22 years ago. His smoking use included cigarettes. His smokeless tobacco use includes snuff. He reports current alcohol use. He reports that he does not use drugs.  ROS: Pertinent ROS in HPI  Physical Exam: BP (!) 149/92   Pulse 67   Ht 6' (1.829 m)   Wt 250 lb (113.4 kg)   SpO2 99%   BMI 33.91 kg/m   Constitutional:  Well nourished. Alert and oriented, No acute distress. HEENT: [place] AT, moist mucus membranes.  Trachea midline, no masses. Cardiovascular: No clubbing, cyanosis, or edema. Respiratory: Normal respiratory effort, no increased work of breathing. GU: No CVA tenderness.  No bladder fullness or masses.  Patient with circumcised phallus.  Urethral meatus is patent.  No penile discharge. No penile lesions or rashes. Scrotum without lesions, cysts, rashes and/or edema.  Testicles are located scrotally bilaterally. No masses are appreciated in the testicles. Left and right epididymis are normal. Rectal: Patient with  normal sphincter tone. Anus and perineum without scarring or rashes. No rectal masses are appreciated. Prostate is approximately 45 grams, no nodules are appreciated. Seminal vesicles could not be palpated.  Neurologic: Grossly intact, no focal deficits, moving all 4 extremities. Psychiatric: Normal mood and affect.   Laboratory  Data: Results for orders placed or performed in visit on 03/27/23  PSA   Collection Time: 03/27/23  8:35 AM  Result Value Ref Range   Prostate Specific Ag, Serum 1.0 0.0 - 4.0 ng/mL   Component     Latest Ref Rng 03/25/2023  Hemoglobin     13.0 - 17.7 g/dL 21.3   HCT     08.6 - 57.8 % 50.5     Component  Latest Ref Rng 03/25/2023  Testosterone     264 - 916 ng/dL 161 (H)     Legend: (H) High   I have reviewed the labs.   Pertinent Imaging: N/A     Assessment & Plan:    1. Testosterone deficiency  -testosterone levels are therapeutic -H & H WNL -testosterone cypionate 200 mg/milliliters, 0.5 cc every 7 days   2. BPH with LUTS -PSA stable -symptoms - frequency -continue conservative management, avoiding bladder irritants and timed voiding's  3. Erectile dysfunction:    -Responding well with testosterone treatment  Return in about 3 months (around 07/07/2023) for HCT, HBG, testosterone only .  These notes generated with voice recognition software. I apologize for typographical errors.  Cloretta Ned  [facility] [address]  [address] [place], [place] [address] [phone]

## 2023-04-09 ENCOUNTER — Ambulatory Visit: Payer: BC Managed Care – PPO | Admitting: Urology

## 2023-04-09 ENCOUNTER — Encounter: Payer: Self-pay | Admitting: Urology

## 2023-04-09 VITALS — BP 149/92 | HR 67 | Ht 72.0 in | Wt 250.0 lb

## 2023-04-09 DIAGNOSIS — N138 Other obstructive and reflux uropathy: Secondary | ICD-10-CM

## 2023-04-09 DIAGNOSIS — E291 Testicular hypofunction: Secondary | ICD-10-CM

## 2023-04-09 DIAGNOSIS — N529 Male erectile dysfunction, unspecified: Secondary | ICD-10-CM

## 2023-04-09 DIAGNOSIS — N401 Enlarged prostate with lower urinary tract symptoms: Secondary | ICD-10-CM

## 2023-04-09 MED ORDER — TESTOSTERONE CYPIONATE 200 MG/ML IM SOLN: 100.0000 mg | INTRAMUSCULAR | 0 refills | Status: DC

## 2023-04-09 NOTE — Addendum Note (Signed)
Addended by: Cannon Kettle on: 04/09/2023 09:06 AM   Modules accepted: Orders

## 2023-04-11 DIAGNOSIS — H903 Sensorineural hearing loss, bilateral: Secondary | ICD-10-CM | POA: Diagnosis not present

## 2023-05-06 DIAGNOSIS — H903 Sensorineural hearing loss, bilateral: Secondary | ICD-10-CM | POA: Diagnosis not present

## 2023-05-07 ENCOUNTER — Ambulatory Visit: Payer: BC Managed Care – PPO | Admitting: Internal Medicine

## 2023-05-07 ENCOUNTER — Other Ambulatory Visit: Payer: BC Managed Care – PPO

## 2023-05-13 DIAGNOSIS — H903 Sensorineural hearing loss, bilateral: Secondary | ICD-10-CM | POA: Diagnosis not present

## 2023-06-06 DIAGNOSIS — K4090 Unilateral inguinal hernia, without obstruction or gangrene, not specified as recurrent: Secondary | ICD-10-CM | POA: Diagnosis not present

## 2023-06-07 ENCOUNTER — Other Ambulatory Visit: Payer: Self-pay | Admitting: Urology

## 2023-06-07 MED ORDER — SYRINGE/NEEDLE (DISP) 21G X 1-1/2" 3 ML MISC: 0 refills | Status: DC

## 2023-06-07 MED ORDER — BD SAFETYGLIDE NEEDLE 18G X 1-1/2" MISC: 0 refills | Status: DC

## 2023-07-05 ENCOUNTER — Other Ambulatory Visit

## 2023-07-05 DIAGNOSIS — E291 Testicular hypofunction: Secondary | ICD-10-CM | POA: Diagnosis not present

## 2023-07-06 LAB — HEMOGLOBIN AND HEMATOCRIT, BLOOD
Hematocrit: 52.9 % — ABNORMAL HIGH (ref 37.5–51.0)
Hemoglobin: 17.4 g/dL (ref 13.0–17.7)

## 2023-07-06 LAB — TESTOSTERONE: Testosterone: 273 ng/dL (ref 264–916)

## 2023-07-08 ENCOUNTER — Other Ambulatory Visit: Payer: BC Managed Care – PPO

## 2023-07-08 ENCOUNTER — Other Ambulatory Visit: Payer: Self-pay

## 2023-07-08 DIAGNOSIS — E349 Endocrine disorder, unspecified: Secondary | ICD-10-CM

## 2023-07-08 DIAGNOSIS — E291 Testicular hypofunction: Secondary | ICD-10-CM

## 2023-08-16 DIAGNOSIS — E291 Testicular hypofunction: Secondary | ICD-10-CM

## 2023-08-16 MED ORDER — TESTOSTERONE CYPIONATE 200 MG/ML IM SOLN
100.0000 mg | INTRAMUSCULAR | 0 refills | Status: DC
Start: 2023-08-16 — End: 2024-01-08

## 2023-09-16 DIAGNOSIS — M62830 Muscle spasm of back: Secondary | ICD-10-CM | POA: Diagnosis not present

## 2023-09-16 DIAGNOSIS — M542 Cervicalgia: Secondary | ICD-10-CM | POA: Diagnosis not present

## 2023-09-16 DIAGNOSIS — M9902 Segmental and somatic dysfunction of thoracic region: Secondary | ICD-10-CM | POA: Diagnosis not present

## 2023-09-16 DIAGNOSIS — M9901 Segmental and somatic dysfunction of cervical region: Secondary | ICD-10-CM | POA: Diagnosis not present

## 2023-09-27 DIAGNOSIS — M9901 Segmental and somatic dysfunction of cervical region: Secondary | ICD-10-CM | POA: Diagnosis not present

## 2023-09-27 DIAGNOSIS — M62830 Muscle spasm of back: Secondary | ICD-10-CM | POA: Diagnosis not present

## 2023-09-27 DIAGNOSIS — M9902 Segmental and somatic dysfunction of thoracic region: Secondary | ICD-10-CM | POA: Diagnosis not present

## 2023-09-27 DIAGNOSIS — M542 Cervicalgia: Secondary | ICD-10-CM | POA: Diagnosis not present

## 2023-10-03 ENCOUNTER — Other Ambulatory Visit

## 2023-10-03 DIAGNOSIS — E349 Endocrine disorder, unspecified: Secondary | ICD-10-CM | POA: Diagnosis not present

## 2023-10-03 DIAGNOSIS — E291 Testicular hypofunction: Secondary | ICD-10-CM | POA: Diagnosis not present

## 2023-10-04 LAB — HEMOGLOBIN AND HEMATOCRIT, BLOOD
Hematocrit: 52.5 % — ABNORMAL HIGH (ref 37.5–51.0)
Hemoglobin: 16.8 g/dL (ref 13.0–17.7)

## 2023-10-04 LAB — TESTOSTERONE: Testosterone: 529 ng/dL (ref 264–916)

## 2023-10-09 NOTE — Progress Notes (Signed)
 10/10/2023 9:23 AM   David Aguirre January 08, 1976 [id number]  Referring provider: Sharma Coyer, MD [address] [place],  [place] [address]  Urological history: 1.  Hypogonadism -Contributing factors of age and obesity -Testosterone  level (09/2023) 529 -Hemoglobin/hematocrit (09/2023) 16.8/52.5 -Testosterone  cypionate 200 mg/mL, 0.5 cc every 7 days (Sundays)  2. BPH with LU TS -PSA (02/2023) 1.0 (2.0)  -paternal grandfather w/ prostate cancer -finasteride 5 mg daily -actually taking it for hair loss  Chief Complaint  Patient presents with   Hypogonadism in male    HPI: David Aguirre is a 48 y.o. male who presents today for 3 month follow up.  Previous records reviewed.   He is at therapeutic levels with his testosterone .  His hemoglobin hematocrit are creeping up.  He is doing well on testosterone  200 mg/milliliters 0.5 cc every 7 days.    He states 1 time last week he had 3 cups of coffee and limited intake of other types of fluid and he had urinary urgency and frequency going 6 times within an hour.  His symptoms then abated.  Patient denies any modifying or aggravating factors.  Patient denies any recent UTI's, gross hematuria, dysuria or suprapubic/flank pain.  Patient denies any fevers, chills, nausea or vomiting.    He cannot donate blood because he is on finasteride, but if he discontinues finasteride for 1 month he is eligible.     PMH: Past Medical History:  Diagnosis Date   Low testosterone      Surgical History: Past Surgical History:  Procedure Laterality Date   CHOLECYSTECTOMY N/A 11/28/2014   Procedure: LAPAROSCOPIC CHOLECYSTECTOMY WITH INTRAOPERATIVE CHOLANGIOGRAM;  Surgeon: Charlie FORBES Fell, MD;  Location: [facility] ORS;  Service: General;  Laterality: N/A;   COLONOSCOPY WITH PROPOFOL  N/A 10/06/2021   Procedure: COLONOSCOPY WITH PROPOFOL ;  Surgeon: Jinny Carmine, MD;  Location: [facility] ENDOSCOPY;  Service: Endoscopy;   Laterality: N/A;   NASAL SEPTUM SURGERY     NASAL SINUS SURGERY     TONSILLECTOMY     TURBINATE REDUCTION Bilateral     Home Medications:  Allergies as of 10/10/2023   No Known Allergies      Medication List        Accurate as of October 10, 2023  9:23 AM. If you have any questions, ask your nurse or doctor.          BD Disp Needles 21G X 1-1/2 Misc Generic drug: NEEDLE (DISP) 21 G 1 mg by Does not apply route every 14 (fourteen) days. Started by: CLOTILDA CORNWALL   BD SafetyGlide Needle 18G X 1-1/2 Misc Generic drug: NEEDLE (DISP) 18 G Use to draw-up testosterone  then change to 21g needle for injection.   finasteride 5 MG tablet Commonly known as: PROSCAR Take 5 mg by mouth daily.   MULTIPLE VITAMIN PO Take by mouth.   naproxen  500 MG tablet Commonly known as: NAPROSYN  Take 1 tablet (500 mg total) by mouth 2 (two) times daily with a meal.   phentermine  37.5 MG capsule Take 1 capsule (37.5 mg total) by mouth every morning.   SYRINGE-NEEDLE (DISP) 3 ML 21G X 1-1/2 3 ML Misc Use to inject testosterone  every 14 days   testosterone  cypionate 200 MG/ML injection Commonly known as: DEPOTESTOSTERONE CYPIONATE Inject 0.5 mLs (100 mg total) into the muscle every 7 (seven) days.   triamcinolone  cream 0.1 % Commonly known as: KENALOG  Daily at bedtime for 2 weeks        Allergies: No Known Allergies  Family History: Family History  Problem Relation Age of Onset   Diabetes Mother    Hyperlipidemia Mother    Hyperlipidemia Father    Prostate cancer Father    Prostate cancer Paternal Grandfather     Social History:  reports that he quit smoking about 22 years ago. His smoking use included cigarettes. His smokeless tobacco use includes snuff. He reports current alcohol use. He reports that he does not use drugs.  ROS: Pertinent ROS in HPI  Physical Exam: BP (!) 144/84 (BP Location: Left Arm, Patient Position: Sitting, Cuff Size: Large)   Pulse 74   Ht  6' (1.829 m)   Wt 254 lb 14.4 oz (115.6 kg)   BMI 34.57 kg/m   Constitutional:  Well nourished. Alert and oriented, No acute distress. HEENT: [place] AT, moist mucus membranes.  Trachea midline Cardiovascular: No clubbing, cyanosis, or edema. Respiratory: Normal respiratory effort, no increased work of breathing. Neurologic: Grossly intact, no focal deficits, moving all 4 extremities. Psychiatric: Normal mood and affect.   Laboratory Data: See HPI and EPIC I have reviewed the labs.   Pertinent Imaging: N/A     Assessment & Plan:    1. Hypogonadism  -testosterone  levels are therapeutic -HCT slightly elevated, he may consider stopping the finasteride for 1 month so that he can go ahead and donate blood -testosterone  cypionate 200 mg/milliliters, 0.5 cc every 7 days   2. BPH with LUTS -symptoms - frequency -continue conservative management, avoiding bladder irritants and timed voiding's -Will check PSA with next blood draw in 3 months  3. Erectile dysfunction:    -Responding well with testosterone  treatment  Return in about 3 months (around 01/10/2024) for PSA, testosterone , hemoglobin, hematocrit only .  These notes generated with voice recognition software. I apologize for typographical errors.  [name]  [facility] [address]  [address] [place], [place] [address] [phone]

## 2023-10-10 ENCOUNTER — Ambulatory Visit: Admitting: Urology

## 2023-10-10 ENCOUNTER — Encounter: Payer: Self-pay | Admitting: Internal Medicine

## 2023-10-10 ENCOUNTER — Encounter: Payer: Self-pay | Admitting: Urology

## 2023-10-10 VITALS — BP 144/84 | HR 74 | Ht 72.0 in | Wt 254.9 lb

## 2023-10-10 DIAGNOSIS — N529 Male erectile dysfunction, unspecified: Secondary | ICD-10-CM | POA: Diagnosis not present

## 2023-10-10 DIAGNOSIS — E291 Testicular hypofunction: Secondary | ICD-10-CM

## 2023-10-10 DIAGNOSIS — N401 Enlarged prostate with lower urinary tract symptoms: Secondary | ICD-10-CM | POA: Diagnosis not present

## 2023-10-10 DIAGNOSIS — N138 Other obstructive and reflux uropathy: Secondary | ICD-10-CM

## 2023-10-10 MED ORDER — BD DISP NEEDLES 21G X 1-1/2" MISC: 1.0000 mg | 0 refills | Status: DC

## 2023-10-10 MED ORDER — BD SAFETYGLIDE NEEDLE 18G X 1-1/2" MISC: 0 refills | Status: DC

## 2023-10-10 MED ORDER — SYRINGE/NEEDLE (DISP) 21G X 1-1/2" 3 ML MISC: 0 refills | Status: DC

## 2023-10-23 DIAGNOSIS — M542 Cervicalgia: Secondary | ICD-10-CM | POA: Diagnosis not present

## 2023-10-23 DIAGNOSIS — M9902 Segmental and somatic dysfunction of thoracic region: Secondary | ICD-10-CM | POA: Diagnosis not present

## 2023-10-23 DIAGNOSIS — M62830 Muscle spasm of back: Secondary | ICD-10-CM | POA: Diagnosis not present

## 2023-10-23 DIAGNOSIS — M9901 Segmental and somatic dysfunction of cervical region: Secondary | ICD-10-CM | POA: Diagnosis not present

## 2023-11-19 IMAGING — CR DG CHEST 2V
3 series · 3 of 3 positions shown · non-contrast
Comparison: Chest x-ray December 27, 2008.

CLINICAL DATA: persistent cough 3 weeks

EXAM:
CHEST - 2 VIEW

[chest pa (1 of 2)]
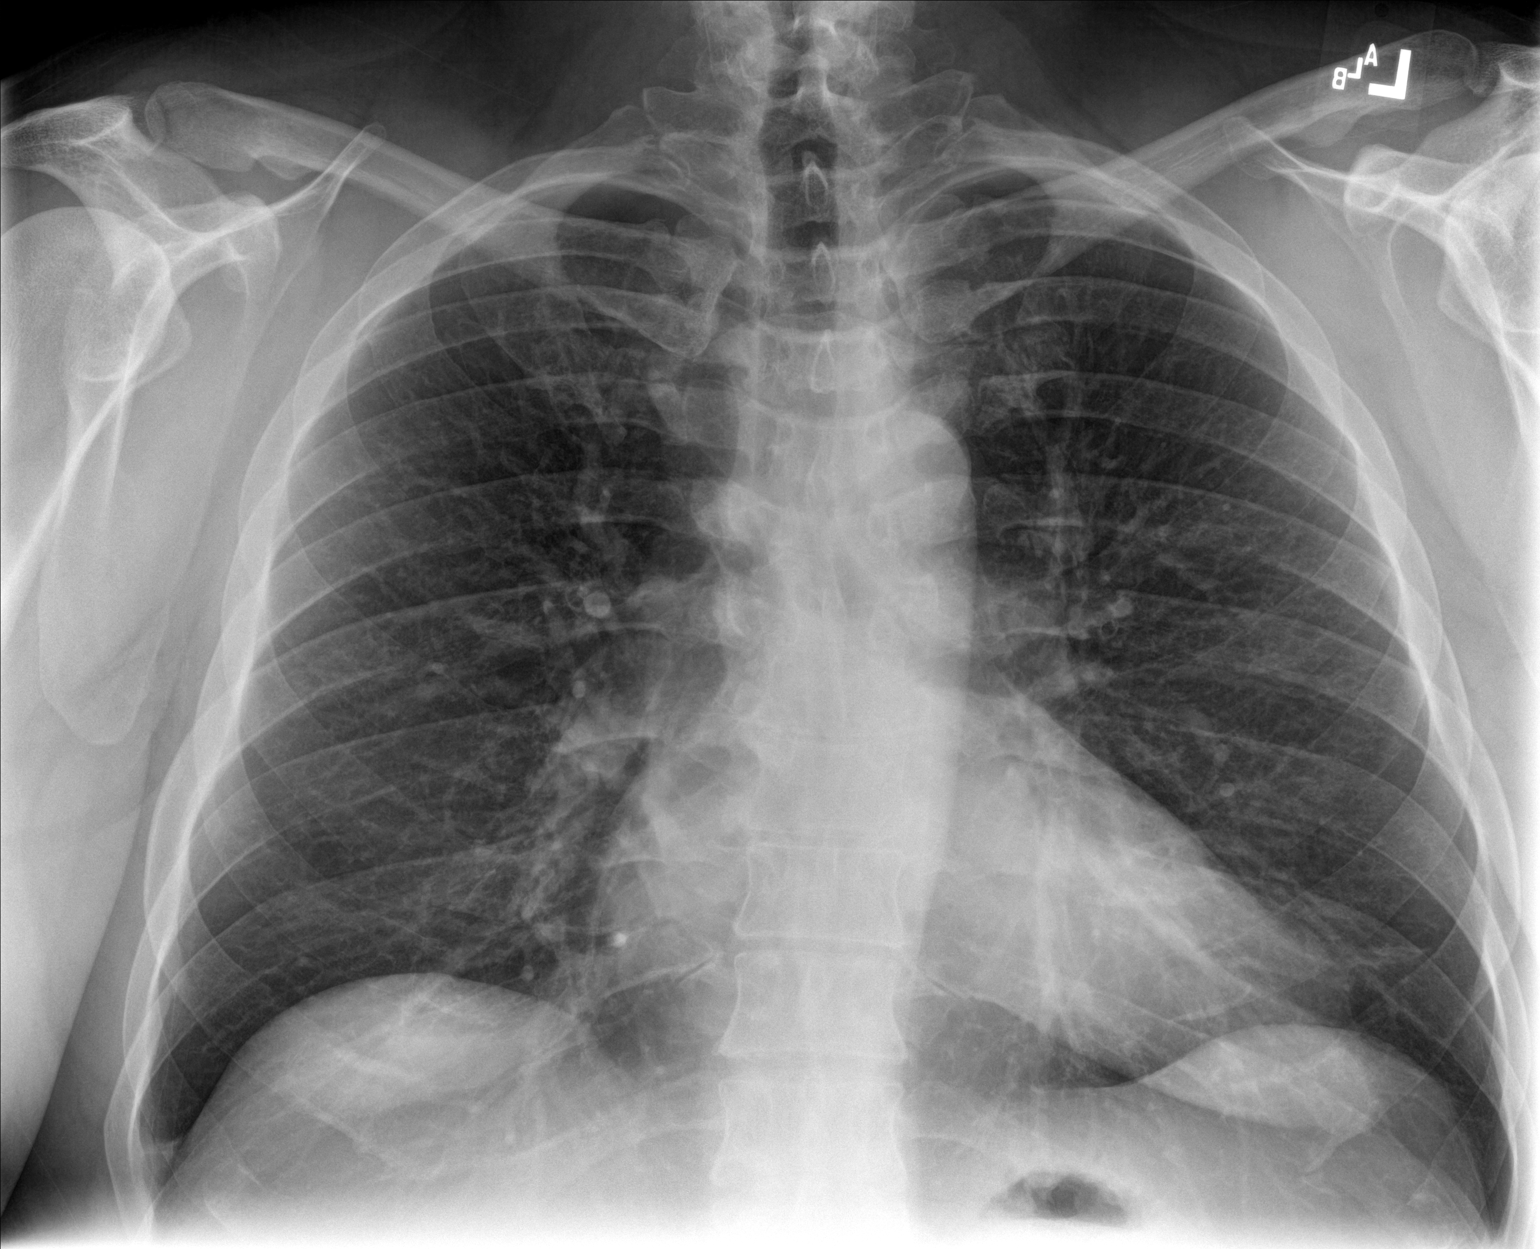

[chest lat]
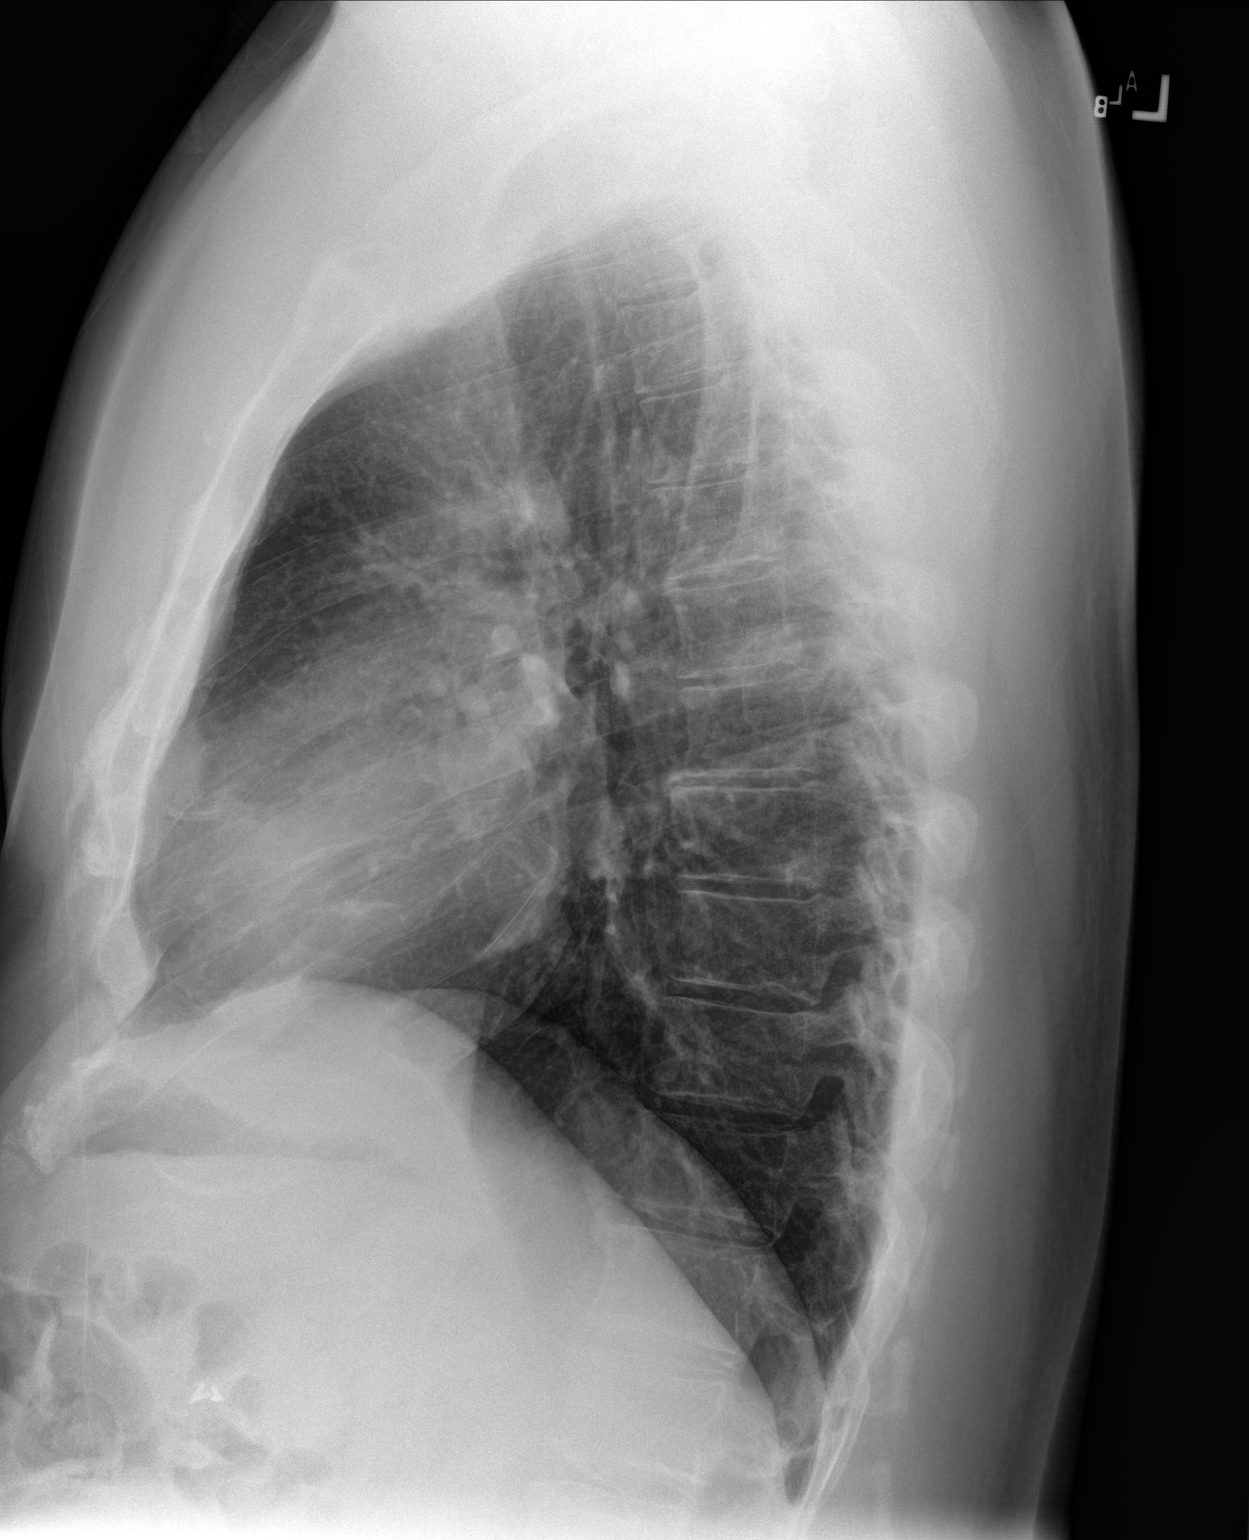

[chest pa (2 of 2)]
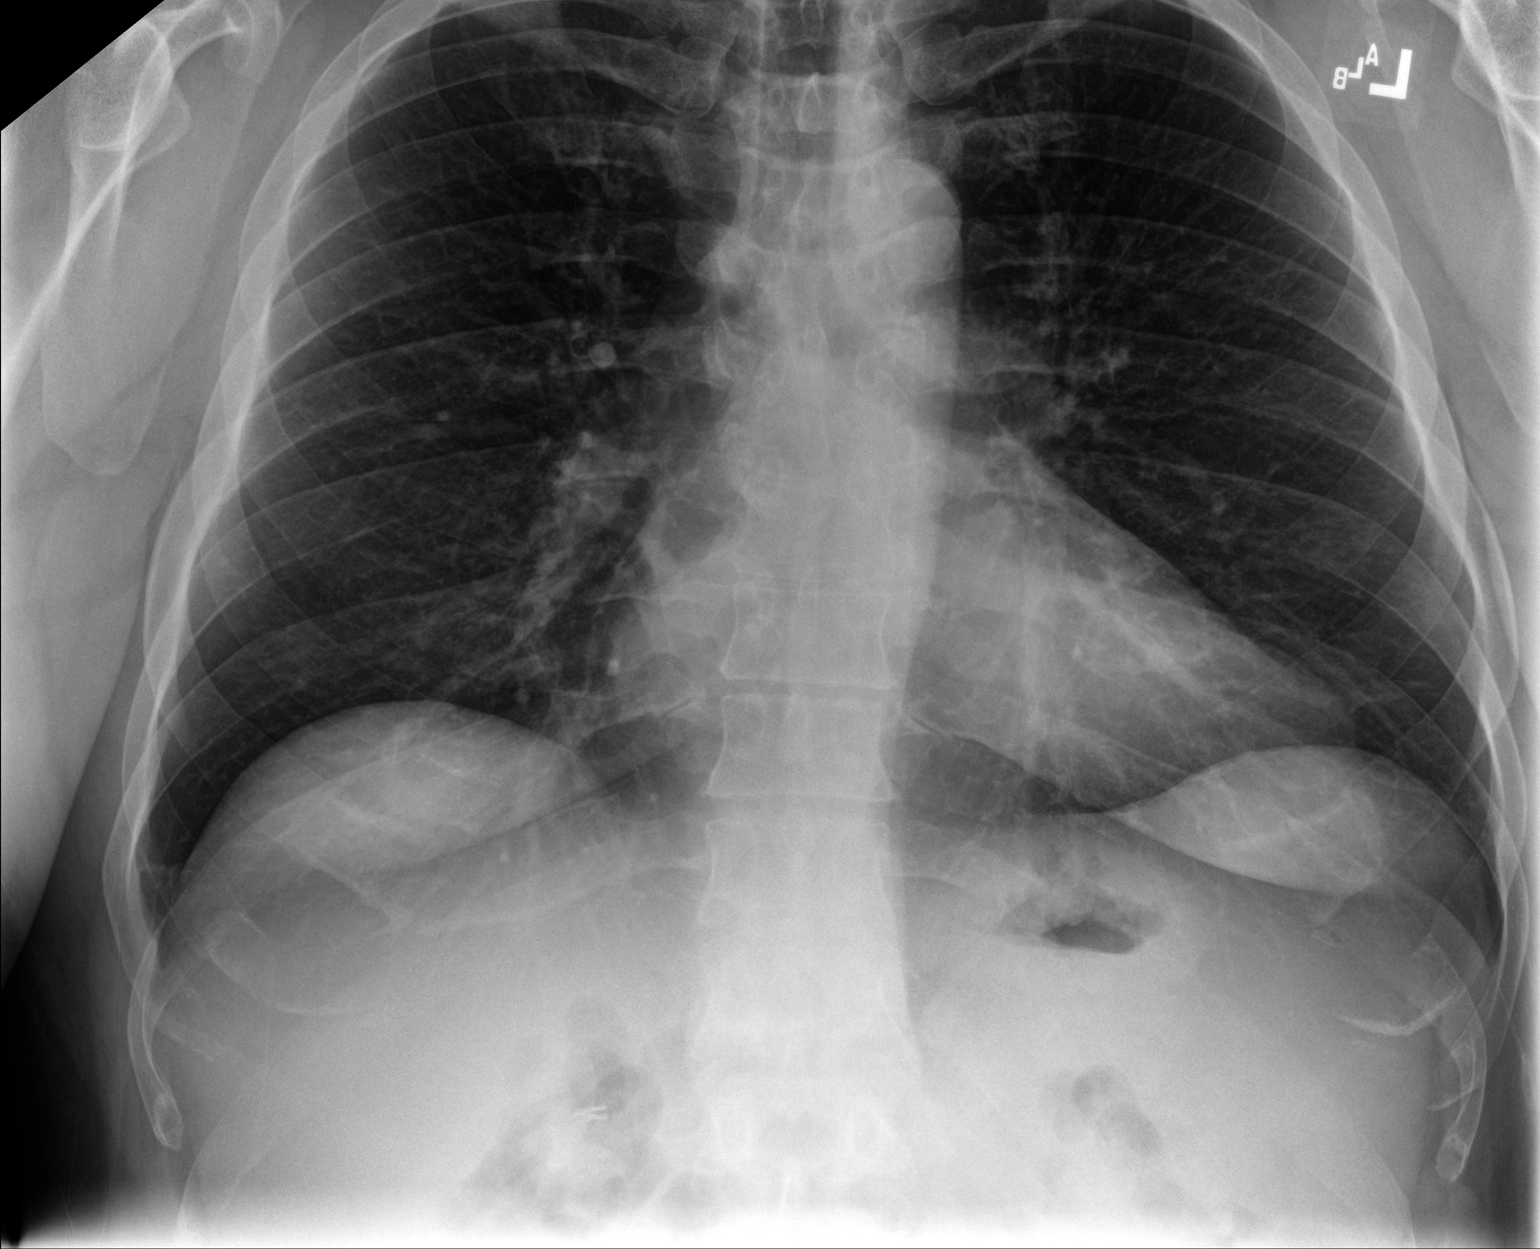

[3 of 3 positions shown; findings below may reference images not displayed]

FINDINGS: No consolidation. No visible pleural effusions or pneumothorax.
Cardiomediastinal silhouette is within normal limits. No displaced
fracture.
IMPRESSION: No evidence of acute cardiopulmonary disease.

## 2023-11-25 DIAGNOSIS — L648 Other androgenic alopecia: Secondary | ICD-10-CM | POA: Diagnosis not present

## 2023-11-25 DIAGNOSIS — L578 Other skin changes due to chronic exposure to nonionizing radiation: Secondary | ICD-10-CM | POA: Diagnosis not present

## 2023-11-25 DIAGNOSIS — Z86018 Personal history of other benign neoplasm: Secondary | ICD-10-CM | POA: Diagnosis not present

## 2023-12-31 DIAGNOSIS — E291 Testicular hypofunction: Secondary | ICD-10-CM | POA: Diagnosis not present

## 2023-12-31 DIAGNOSIS — Z0000 Encounter for general adult medical examination without abnormal findings: Secondary | ICD-10-CM | POA: Diagnosis not present

## 2023-12-31 DIAGNOSIS — Z13220 Encounter for screening for lipoid disorders: Secondary | ICD-10-CM | POA: Diagnosis not present

## 2023-12-31 DIAGNOSIS — Z114 Encounter for screening for human immunodeficiency virus [HIV]: Secondary | ICD-10-CM | POA: Diagnosis not present

## 2023-12-31 DIAGNOSIS — Z1331 Encounter for screening for depression: Secondary | ICD-10-CM | POA: Diagnosis not present

## 2023-12-31 DIAGNOSIS — Z1159 Encounter for screening for other viral diseases: Secondary | ICD-10-CM | POA: Diagnosis not present

## 2024-01-01 ENCOUNTER — Other Ambulatory Visit

## 2024-01-01 DIAGNOSIS — N529 Male erectile dysfunction, unspecified: Secondary | ICD-10-CM | POA: Diagnosis not present

## 2024-01-01 DIAGNOSIS — R972 Elevated prostate specific antigen [PSA]: Secondary | ICD-10-CM | POA: Diagnosis not present

## 2024-01-01 DIAGNOSIS — N401 Enlarged prostate with lower urinary tract symptoms: Secondary | ICD-10-CM | POA: Diagnosis not present

## 2024-01-01 DIAGNOSIS — E291 Testicular hypofunction: Secondary | ICD-10-CM

## 2024-01-01 DIAGNOSIS — N138 Other obstructive and reflux uropathy: Secondary | ICD-10-CM | POA: Diagnosis not present

## 2024-01-02 LAB — HEMOGLOBIN AND HEMATOCRIT, BLOOD
Hematocrit: 53.2 % — ABNORMAL HIGH (ref 37.5–51.0)
Hemoglobin: 17.5 g/dL (ref 13.0–17.7)

## 2024-01-02 LAB — PSA: Prostate Specific Ag, Serum: 1.9 ng/mL (ref 0.0–4.0)

## 2024-01-02 LAB — TESTOSTERONE: Testosterone: 950 ng/dL — ABNORMAL HIGH (ref 264–916)

## 2024-01-06 NOTE — Progress Notes (Unsigned)
 01/08/2024 12:19 PM   David Aguirre January 18, 1976 [id number]  Referring provider: Sharma Coyer, MD [address] [place],  [place] [address]  Urological history: 1.  Hypogonadism -Contributing factors of age and obesity -Testosterone  level (12/2023) 950 -Hemoglobin/hematocrit (12/2023) 17.5/53.2 -Testosterone  cypionate 200 mg/mL, 0.5 cc every 7 days (Sundays)  2. BPH with LU TS -PSA (12/2023) 1.9 (3.8)  -paternal grandfather w/ prostate cancer -finasteride 5 mg daily -actually taking it for hair loss  No chief complaint on file.   HPI: David Aguirre is a 48 y.o. male who presents today for follow up.  Previous records reviewed.   He reports good adherence to testosterone  cypionate, 200 mg/milliliters, 0.5 cc every 7 days.  Denies new complaints of low libido, erectile dysfunction, fatigue, or mood changes.  No complaints of gynecomastia, visual changes, or thromboembolic symptoms.  Energy level, libido and overall sense of wellbeing being reported as stable/ improved compared to prior visit.    Testosterone  level 950  Hemoglobin/hematocrit 17.5/53.2  Liver enzymes normal   I PSS ***  He reports sensation of incomplete bladder emptying,   urinary frequency,   urinary intermittency,   urinary urgency,   a weak urinary stream,   having to strain to void,   nocturia x ***,   leaking before being able to reach the restroom,   leaking with coughing,   leaking without awareness,   and post void dribbling.     He is wearing *** pads//depends  daily.    Patient denies any modifying or aggravating factors.  Patient denies any recent UTI's, gross hematuria, dysuria or suprapubic/flank pain.  Patient denies any fevers, chills, nausea or vomiting.  ***  He has a family history of PCa, colon cancer, ovarian cancer and/or breast cancer with ***.   He does not have a family history of PCa, colon cancer, ovarian cancer, and/or breast  cancer .***     UA @ PCP' s office yellow clear, specific Auty greater than 1.030, pH 5.5, trace ketone, 03 WBCs, rare bacteria  PVR***  PSA 1.9  Serum creatinine 1.3, eGFR 68  SHIM ***  He does not have confidence that he could get and keep an erection, his erections are not firm enough for penetrative intercourse, he has difficulty maintaining his erections,  and he is not finding intercourse satisfactory for him.  ***  Patient still having spontaneous erections.  ***   He denies any pain or curvature with erections.    He is not able to ejaculate, has pain with ejaculation, and has blood in his ejaculate fluid.   ***  Cholesterol normal   TSH 1.025    Tried and failed ***    PMH: Past Medical History:  Diagnosis Date   Low testosterone      Surgical History: Past Surgical History:  Procedure Laterality Date   CHOLECYSTECTOMY N/A 11/28/2014   Procedure: LAPAROSCOPIC CHOLECYSTECTOMY WITH INTRAOPERATIVE CHOLANGIOGRAM;  Surgeon: Charlie FORBES Fell, MD;  Location: [facility] ORS;  Service: General;  Laterality: N/A;   COLONOSCOPY WITH PROPOFOL  N/A 10/06/2021   Procedure: COLONOSCOPY WITH PROPOFOL ;  Surgeon: Jinny Carmine, MD;  Location: [facility] END[facility];  Service: Endoscopy;  Laterality: N/A;   NASAL SEPTUM SURGERY     NASAL SINUS SURGERY     TONSILLECTOMY     TURBINATE REDUCTION Bilateral     Home Medications:  Allergies as of 01/08/2024   No Known Allergies      Medication List  Accurate as of January 06, 2024 12:19 PM. If you have any questions, ask your nurse or doctor.          BD Disp Needles 21G X 1-1/2 Misc Generic drug: NEEDLE (DISP) 21 G 1 mg by Does not apply route every 14 (fourteen) days.   BD SafetyGlide Needle 18G X 1-1/2 Misc Generic drug: NEEDLE (DISP) 18 G Use to draw-up testosterone  then change to 21g needle for injection.   finasteride 5 MG tablet Commonly known as: PROSCAR Take 5 mg by mouth daily.   MULTIPLE VITAMIN PO Take by  mouth.   naproxen  500 MG tablet Commonly known as: NAPROSYN  Take 1 tablet (500 mg total) by mouth 2 (two) times daily with a meal.   phentermine  37.5 MG capsule Take 1 capsule (37.5 mg total) by mouth every morning.   SYRINGE-NEEDLE (DISP) 3 ML 21G X 1-1/2 3 ML Misc Use to inject testosterone  every 14 days   testosterone  cypionate 200 MG/ML injection Commonly known as: DEPOTESTOSTERONE CYPIONATE Inject 0.5 mLs (100 mg total) into the muscle every 7 (seven) days.   triamcinolone  cream 0.1 % Commonly known as: KENALOG  Daily at bedtime for 2 weeks        Allergies: No Known Allergies  Family History: Family History  Problem Relation Age of Onset   Diabetes Mother    Hyperlipidemia Mother    Hyperlipidemia Father    Prostate cancer Father    Prostate cancer Paternal Grandfather     Social History:  reports that he quit smoking about 22 years ago. His smoking use included cigarettes. His smokeless tobacco use includes snuff. He reports current alcohol use. He reports that he does not use drugs.  ROS: Pertinent ROS in HPI  Physical Exam: There were no vitals taken for this visit.  Constitutional:  Well nourished. Alert and oriented, No acute distress. HEENT: [place] AT, moist mucus membranes.  Trachea midline, no masses. Cardiovascular: No clubbing, cyanosis, or edema. Respiratory: Normal respiratory effort, no increased work of breathing. GI: Abdomen is soft, non tender, non distended, no abdominal masses. Liver and spleen not palpable.  No hernias appreciated.  Stool sample for occult testing is not indicated.   GU: No CVA tenderness.  No bladder fullness or masses.  Patient with circumcised/uncircumcised phallus. ***Foreskin easily retracted***  Urethral meatus is patent.  No penile discharge. No penile lesions or rashes. Scrotum without lesions, cysts, rashes and/or edema.  Testicles are located scrotally bilaterally. No masses are appreciated in the testicles. Left and  right epididymis are normal. Rectal: Patient with  normal sphincter tone. Anus and perineum without scarring or rashes. No rectal masses are appreciated. Prostate is approximately *** grams, *** nodules are appreciated. Seminal vesicles are normal. Skin: No rashes, bruises or suspicious lesions. Lymph: No cervical or inguinal adenopathy. Neurologic: Grossly intact, no focal deficits, moving all 4 extremities. Psychiatric: Normal mood and affect.   Laboratory Data: See HPI and EPIC I have reviewed the labs.   Pertinent Imaging: N/A     Assessment & Plan:    1. Hypogonadism  -testosterone  levels are super therapeutic -HCT elevated, he may consider stopping the finasteride for 1 month so that he can go ahead and donate blood *** -will need to decrease testosterone  cypionate 200 mg/milliliters, 0.5 cc every 7 days ***  2. BPH with LU TS - mild, moderate severe symptoms *** and he is *** - no signs of retention, infection or malignancy *** - PSA up to date  - DRE  benign *** - UA benign *** - PVR < 300 cc *** - most bothersome symptoms are *** - encouraged avoiding bladder irritants, fluid restriction before bedtime and timed voiding's - Initiate alpha-blocker (***), discussed side effects *** - Initiate 5 alpha reductase inhibitor (***), discussed side effects *** - Continue tamsulosin 0.4 mg daily, alfuzosin 10 mg daily, Rapaflo 8 mg daily, terazosin, doxazosin, Cialis 5 mg daily and finasteride 5 mg daily, dutasteride 0.5 mg daily***:refills given - Cannot tolerate medication or medication failure, schedule cystoscopy *** - educated on red flag symptoms: acute retention, gross hematuria, fever, severe pain - advised to call clinic or go to the ED if these occur - return to clinic in *** symptom re-evaluation *** Age 2-49: 0.68 ng/mL Age 9-54: 0.88 ng/mL Age 50-59: 0.98 ng/mL  -PSA of > or = 1.3 at age 7, PSA of > or = 1.6 at ages 22 to 5 contributed to nearly half of  prostate cancer deaths over the next 25 to 30 years    3. Erectile dysfunction:    -***  No follow-ups on file.  These notes generated with voice recognition software. I apologize for typographical errors.  [name]  [facility] [address]  [address] [place], [place] [address] [phone]

## 2024-01-08 ENCOUNTER — Ambulatory Visit: Admitting: Urology

## 2024-01-08 ENCOUNTER — Encounter: Payer: Self-pay | Admitting: Urology

## 2024-01-08 VITALS — BP 136/92 | HR 76 | Ht 72.0 in | Wt 253.0 lb

## 2024-01-08 DIAGNOSIS — N529 Male erectile dysfunction, unspecified: Secondary | ICD-10-CM

## 2024-01-08 DIAGNOSIS — N401 Enlarged prostate with lower urinary tract symptoms: Secondary | ICD-10-CM | POA: Diagnosis not present

## 2024-01-08 DIAGNOSIS — N138 Other obstructive and reflux uropathy: Secondary | ICD-10-CM

## 2024-01-08 DIAGNOSIS — E291 Testicular hypofunction: Secondary | ICD-10-CM | POA: Diagnosis not present

## 2024-01-08 MED ORDER — TESTOSTERONE CYPIONATE 200 MG/ML IM SOLN: 100.0000 mg | INTRAMUSCULAR | 0 refills | Status: AC

## 2024-01-08 MED ORDER — SYRINGE/NEEDLE (DISP) 21G X 1-1/2" 3 ML MISC: 0 refills | Status: AC

## 2024-01-08 MED ORDER — BD DISP NEEDLES 21G X 1-1/2" MISC: 1.0000 mg | 0 refills | Status: AC

## 2024-01-08 MED ORDER — BD SAFETYGLIDE NEEDLE 18G X 1-1/2" MISC: 0 refills | Status: AC

## 2024-03-03 NOTE — Telephone Encounter (Signed)
 Patient scheduled for lab appointment on 03/11/24

## 2024-03-09 ENCOUNTER — Other Ambulatory Visit: Payer: Self-pay

## 2024-03-09 DIAGNOSIS — E291 Testicular hypofunction: Secondary | ICD-10-CM

## 2024-03-11 ENCOUNTER — Other Ambulatory Visit

## 2024-03-11 DIAGNOSIS — N529 Male erectile dysfunction, unspecified: Secondary | ICD-10-CM

## 2024-03-11 DIAGNOSIS — N401 Enlarged prostate with lower urinary tract symptoms: Secondary | ICD-10-CM

## 2024-03-11 DIAGNOSIS — E291 Testicular hypofunction: Secondary | ICD-10-CM

## 2024-03-12 ENCOUNTER — Ambulatory Visit: Payer: Self-pay | Admitting: Urology

## 2024-03-12 LAB — HEMOGLOBIN AND HEMATOCRIT, BLOOD
Hematocrit: 52.1 % — ABNORMAL HIGH (ref 37.5–51.0)
Hemoglobin: 17.7 g/dL (ref 13.0–17.7)

## 2024-03-12 LAB — TESTOSTERONE: Testosterone: 504 ng/dL (ref 264–916)
